# Patient Record
Sex: Female | Born: 1955 | Race: White | Hispanic: No | Marital: Married | Smoking: Current every day smoker
Health system: Southern US, Community
[De-identification: ages and names within clinical notes are randomized; demographics above are authoritative.]

## PROBLEM LIST (undated history)

## (undated) DIAGNOSIS — I498 Other specified cardiac arrhythmias: Secondary | ICD-10-CM

## (undated) DIAGNOSIS — K219 Gastro-esophageal reflux disease without esophagitis: Secondary | ICD-10-CM

## (undated) DIAGNOSIS — M8580 Other specified disorders of bone density and structure, unspecified site: Secondary | ICD-10-CM

## (undated) DIAGNOSIS — E785 Hyperlipidemia, unspecified: Secondary | ICD-10-CM

## (undated) DIAGNOSIS — F32A Depression, unspecified: Secondary | ICD-10-CM

## (undated) DIAGNOSIS — J449 Chronic obstructive pulmonary disease, unspecified: Secondary | ICD-10-CM

## (undated) DIAGNOSIS — M79606 Pain in leg, unspecified: Secondary | ICD-10-CM

## (undated) DIAGNOSIS — F329 Major depressive disorder, single episode, unspecified: Secondary | ICD-10-CM

## (undated) HISTORY — DX: Major depressive disorder, single episode, unspecified: F32.9

## (undated) HISTORY — PX: ABDOMINAL HYSTERECTOMY: SHX81

## (undated) HISTORY — DX: Hyperlipidemia, unspecified: E78.5

## (undated) HISTORY — DX: Gastro-esophageal reflux disease without esophagitis: K21.9

## (undated) HISTORY — DX: Other specified cardiac arrhythmias: I49.8

## (undated) HISTORY — PX: OTHER SURGICAL HISTORY: SHX169

## (undated) HISTORY — DX: Depression, unspecified: F32.A

## (undated) HISTORY — DX: Pain in leg, unspecified: M79.606

## (undated) HISTORY — DX: Other specified disorders of bone density and structure, unspecified site: M85.80

## (undated) HISTORY — DX: Chronic obstructive pulmonary disease, unspecified: J44.9

## (undated) HISTORY — PX: CERVICAL DISC SURGERY: SHX588

---

## 2001-02-02 ENCOUNTER — Encounter: Payer: Self-pay | Admitting: Emergency Medicine

## 2001-02-02 ENCOUNTER — Emergency Department (HOSPITAL_COMMUNITY): Admission: EM | Admit: 2001-02-02 | Discharge: 2001-02-02 | Payer: Self-pay | Admitting: Emergency Medicine

## 2001-02-27 ENCOUNTER — Encounter: Admission: RE | Admit: 2001-02-27 | Discharge: 2001-02-27 | Payer: Self-pay | Admitting: Family Medicine

## 2001-02-27 ENCOUNTER — Encounter: Payer: Self-pay | Admitting: Family Medicine

## 2001-03-18 ENCOUNTER — Encounter: Payer: Self-pay | Admitting: Family Medicine

## 2001-03-18 ENCOUNTER — Ambulatory Visit (HOSPITAL_COMMUNITY): Admission: RE | Admit: 2001-03-18 | Discharge: 2001-03-18 | Payer: Self-pay | Admitting: Family Medicine

## 2001-10-02 ENCOUNTER — Ambulatory Visit (HOSPITAL_COMMUNITY): Admission: RE | Admit: 2001-10-02 | Discharge: 2001-10-02 | Payer: Self-pay | Admitting: Neurosurgery

## 2001-10-27 ENCOUNTER — Encounter: Payer: Self-pay | Admitting: Neurosurgery

## 2001-10-27 ENCOUNTER — Inpatient Hospital Stay (HOSPITAL_COMMUNITY): Admission: RE | Admit: 2001-10-27 | Discharge: 2001-10-29 | Payer: Self-pay | Admitting: Neurosurgery

## 2003-03-02 ENCOUNTER — Ambulatory Visit (HOSPITAL_COMMUNITY): Admission: RE | Admit: 2003-03-02 | Discharge: 2003-03-02 | Payer: Self-pay | Admitting: Family Medicine

## 2003-03-02 ENCOUNTER — Encounter: Payer: Self-pay | Admitting: Family Medicine

## 2003-03-18 ENCOUNTER — Ambulatory Visit (HOSPITAL_COMMUNITY): Admission: RE | Admit: 2003-03-18 | Discharge: 2003-03-18 | Payer: Self-pay | Admitting: Family Medicine

## 2003-03-18 ENCOUNTER — Encounter: Payer: Self-pay | Admitting: Family Medicine

## 2004-08-19 HISTORY — PX: ESOPHAGOGASTRODUODENOSCOPY: SHX1529

## 2004-12-19 ENCOUNTER — Ambulatory Visit (HOSPITAL_COMMUNITY): Admission: RE | Admit: 2004-12-19 | Discharge: 2004-12-19 | Payer: Self-pay | Admitting: Family Medicine

## 2004-12-31 ENCOUNTER — Ambulatory Visit (HOSPITAL_COMMUNITY): Admission: RE | Admit: 2004-12-31 | Discharge: 2004-12-31 | Payer: Self-pay | Admitting: Family Medicine

## 2005-07-22 ENCOUNTER — Ambulatory Visit: Payer: Self-pay | Admitting: Internal Medicine

## 2005-07-24 ENCOUNTER — Ambulatory Visit: Payer: Self-pay | Admitting: Internal Medicine

## 2005-07-24 ENCOUNTER — Ambulatory Visit (HOSPITAL_COMMUNITY): Admission: RE | Admit: 2005-07-24 | Discharge: 2005-07-24 | Payer: Self-pay | Admitting: Internal Medicine

## 2005-08-01 ENCOUNTER — Ambulatory Visit: Payer: Self-pay | Admitting: Internal Medicine

## 2005-09-12 ENCOUNTER — Ambulatory Visit: Payer: Self-pay | Admitting: Internal Medicine

## 2005-10-31 ENCOUNTER — Ambulatory Visit (HOSPITAL_COMMUNITY): Admission: RE | Admit: 2005-10-31 | Discharge: 2005-10-31 | Payer: Self-pay | Admitting: Family Medicine

## 2007-08-28 ENCOUNTER — Ambulatory Visit (HOSPITAL_COMMUNITY): Admission: RE | Admit: 2007-08-28 | Discharge: 2007-08-28 | Payer: Self-pay | Admitting: Family Medicine

## 2008-02-15 ENCOUNTER — Ambulatory Visit (HOSPITAL_COMMUNITY): Admission: RE | Admit: 2008-02-15 | Discharge: 2008-02-15 | Payer: Self-pay | Admitting: Internal Medicine

## 2010-06-19 ENCOUNTER — Ambulatory Visit (HOSPITAL_COMMUNITY): Admission: RE | Admit: 2010-06-19 | Discharge: 2010-06-19 | Payer: Self-pay | Admitting: Internal Medicine

## 2011-01-04 NOTE — Op Note (Signed)
NAME:  Rebecca Cohen, Rebecca Cohen             ACCOUNT NO.:  [id number]   MEDICAL RECORD NO.:  [id number]          PATIENT TYPE:  AMB   LOCATION:  DAY                           FACILITY:  [facility]   PHYSICIAN:  R. Roetta Sessions, M.D. DATE OF BIRTH:  08-Apr-1956   DATE OF PROCEDURE:  07/24/2005  DATE OF DISCHARGE:                                 OPERATIVE REPORT   PROCEDURE:  Esophagogastroduodenoscopy with Elease Hashimoto dilation.   INDICATION FOR PROCEDURE:  A 55 year old lady with longstanding  gastroesophageal reflux disease symptoms, intermittent esophageal dysphagia,  undergoing EGD now being done.  The procedure has been discussed with the  patient at length.  The potential risks, benefits, and alternatives have  been reviewed and questions answered and she is agreeable.  Please see  documentation in the medical record.   PROCEDURE NOTE:  O2 saturation, blood pressure, and pulse oximetry monitored  throughout the entire procedure.  Conscious sedation with Versed 6 mg IV,  Demerol 125 mg IV in divided doses.   INSTRUMENT:  Olympus video chip system.   ANESTHESIA:  Cetacaine spray for topical pharyngeal anesthesia.   FINDINGS:  EGD examination of the tubular esophagus revealed what appeared  to be a short cervical esophageal web, otherwise esophageal mucosa appeared  normal.  EG junction was easily traversed.   Stomach:  Gastric cavity was emptied and insufflated well with air.  Thorough examination of the gastric mucosa included retroflexion of the  proximal stomach.  Esophagogastric junction demonstrated no abnormalities.  Pylorus was patent and easily traversed.  Examination of the bulb and second  portion revealed no abnormalities.   Therapeutic/diagnostic maneuvers were performed.  A 54-French Maloney  dilator was passed through the colonic structures with good patient  tolerance.  A look back revealed the web had been ruptured with a  superficial rent of the esophageal mucosa to the UES.   The patient tolerated  the procedure well and was reactive after endoscopy.   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  Otherwise normal esophagus, normal stomach D1 and D2.   RECOMMENDATIONS:  1.  Clear liquids for the remainder of the day.  2.  Advance diet as tolerated tomorrow.  3.  Stop Prilosec.  Begin Zegerid 40 mg orally once daily each morning.  4.  Plan to see this nice lady back in the office in 6-8 weeks.      Rebecca Cohen, M.D.  Electronically Signed     RMR/MEDQ  D:  07/24/2005  T:  07/24/2005  Job:  [id number]   cc:   Mila Homer. Sudie Bailey, M.D.  Fax: [phone]

## 2011-01-04 NOTE — H&P (Signed)
[place]. [facility]  Patient:    Rebecca Cohen, Rebecca Cohen Visit Number: [id number] MRN: [id number]          Service Type: SUR Location: 3000 3021 [id number] Attending Physician:  Emeterio Reeve Dictated by:   Payton Doughty, M.D. Admit Date:  10/27/2001                           History and Physical  ADMISSION DIAGNOSIS:  Spondylosis, C5-C6 and C6-C7.  HISTORY OF PRESENT ILLNESS:  A 55 year old right-handed white girl whos history and physical was initially done by Dr. Newell Coral when she visited in his office in September. She had had pain around her scapular region. Films showed disc herniation at C5-C6 and C6-C7. She did not want to have an operation. She tried NSAIDs, which did not work for her and then she asked me to see her. She has a disc at C5-C6 and C6-C7. She is now admitted for anterior cervicectomy and fusion.  PAST MEDICAL HISTORY:  Remarkable for uterine fibroids.  ALLERGIES:  SULFA.  CURRENT MEDICATIONS: 1. Fosamax 70 mg a week. 2. Hydrocodone. 3. Ativan for panic attacks.  PAST SURGICAL HISTORY:  A vaginal hysterectomy in the past.  FAMILY HISTORY:  Her mama is 58, her daddy is 97. Daddy has diabetes.  SOCIAL HISTORY:  She is not working because of foot pain. She smokes 1/2 pack of cigarettes a day and does not drink alcohol.  REVIEW OF SYSTEMS:  Remarkable for back pain.  PHYSICAL EXAMINATION:  HEENT:  Within normal limits. She has good range of motion.  NECK/CHEST:  Clear.  CARDIAC:  Regular rate and rhythm.  ABDOMEN:  Nontender. No hepatosplenomegaly.  EXTREMITIES:  Without clubbing, cyanosis.  GU:  Deferred.  EXTREMITIES:  Peripheral pulses were good.  NEUROLOGICALLY:  She is awake, alert and oriented. Cranial nerves are intact. Motor exam shows 5/5 strength throughout the upper extremities, except for the right biceps and triceps 5-/5. Range of motion reproduces her neck, shoulder, and arm pain.  An MRI shows significant  spondylotic disease and canal compromise, worse at C5-C6 and C6-C7. Bifemoral compromise at C6-C7. Hoffmans is negative and there is no evidence of myelopathy.  CLINICAL IMPRESSION:  Cervical spondylosis with right C6-C7 radiculopathies.  PLAN:  Anterior cervicectomy and fusion of C5-C6 and C6-C7. The risks and benefits have been discussed with her and she wished to proceed. Dictated by:   Payton Doughty, M.D. Attending Physician:  Emeterio Reeve DD:  10/27/01 TD:  10/28/01 Job: [id number]

## 2011-01-04 NOTE — Op Note (Signed)
[place]. [facility]  Patient:    Rebecca Cohen, Rebecca Cohen Visit Number: [id number] MRN: [id number]          Service Type: SUR Location: 3000 3021 01 Attending Physician:  Emeterio Reeve Dictated by:   Payton Doughty, M.D. Proc. Date: 10/27/01 Admit Date:  10/27/2001                             Operative Report  PREOPERATIVE DIAGNOSIS:  Herniated disk C5-6, spondylosis at C6-7.  POSTOPERATIVE DIAGNOSIS:  Herniated disk C5-6, spondylosis at C6-7.  PROCEDURE:  C5-6, C6-7 anterior cervical diskectomy and fusion with Tether plate.  SURGEON:  Payton Doughty, M.D.  NURSE ASSISTANT:  [facility].  DOCTOR ASSISTANT:  Tanya Nones. Jeral Fruit, M.D.  ANESTHESIA:  General endotracheal.  PREPARATION:  Sterile Betadine prep and scrub with alcohol wipe.  COMPLICATIONS:  None.  DESCRIPTION OF PROCEDURE:  This is a 55 year old right-handed white lady with several spondylosis and disk.  She was taken to the operating room and smoothly anesthetized and intubated, placed supine on the operating table in the Holter head traction.  Following shave, prep, and drape in the usual sterile fashion, the skin was incised in the midline on the medial border of the sternocleidomastoid muscle on the left side one fingerbreadth below the level of the carotid tubercle.  The platysma was identified, elevated, divided, and undermined.  The sternocleidomastoid was identified.  Medial dissection revealed the carotid artery retracted laterally to the left, trachea and esophagus retracted laterally to the right, exposing the bones of the anterior cervical spine.  A marker was placed, intraoperative x-ray obtained to confirm correctness of the level.  Having confirmed correctness of the level, the longus colli was taken down and the Shadow Line retractor placed.  Diskectomy was carried out at C5-6 and C6-7 under gross observation. The operating microscope was then brought in and microsurgical  technique used to dissect the anterior epidural space and remove the remaining disk at both 5-6 and 6-7.  At 5-6 there was a fairly large herniated disk with posterior cord compression that extended toward the right neural foramen.  At 6-7 there was mostly osteophytic disease with prominent compression of the right C7 nerve root.  This was decompressed readily.  The left side was not too much affected.  The wound was irrigated and hemostasis assured.  Seven millimeter bone grafts were fashioned from patellar allograft and tapped into place.  A three-hole Tether plate was then placed with 12 mm screws, two in C5, one in C6, and two in C7.  Intraoperative x-ray showed good placement of bone graft and plate and screws.  The platysma was reapproximated with 3-0 Vicryl in interrupted fashion, the subcutaneous tissue was reapproximated with 3-0 Vicryl in interrupted fashion, and the skin was closed with 4-0 Vicryl in a running subcuticular fashion.  Benzoin and Steri-Strips were placed, made occlusive with Telfa and OpSite.  The patient was then placed in an Aspen collar and returned to the recovery room in good condition. Dictated by:   Payton Doughty, M.D. Attending Physician:  Emeterio Reeve DD:  10/27/01 TD:  10/28/01 Job: [id number]

## 2011-01-04 NOTE — H&P (Signed)
NAME:  Rebecca Cohen, Rebecca Cohen             ACCOUNT NO.:  [id number]   MEDICAL RECORD NO.:  [id number]          PATIENT TYPE:  AMB   LOCATION:                                FACILITY:  [facility]   PHYSICIAN:  R. Roetta Sessions, M.D. DATE OF BIRTH:  [birth year]   DATE OF ADMISSION:  07/22/2005  DATE OF DISCHARGE:  LH                                HISTORY & PHYSICAL   REASON FOR CONSULTATION:  GERD symptoms.   HISTORY OF PRESENT ILLNESS:  Mrs. Rebecca Cohen is a pleasant 55 year old  Caucasian female smoker sent over courtesy of Dr. Gareth Morgan to further  evaluate a several year history of reflux symptoms which he describes as  heartburn on and off.  For years he has been on Nexium and more recently  Prilosec.  Nexium caused abdominal pain but did help her reflux symptoms.  Prilosec is felt to cause constipation, although, it makes some positive  impact on management of her reflux symptoms.  She still has symptoms 3-4  times weekly.  She previously had a habit of consuming quite a bit of  caffeinated beverages.  She drank regular sundrop all the time like water.  At Dr. Michelle Nasuti direction, she has curtailed her caffeinated/carbonated  beverage intake significantly with some improvement of her symptoms.  She  had been on Fosamax for osteopenia which was also stopped recently.  She  does not consume any alcohol or nonsteroidals.  She has occasional  esophageal dysphagia to solid foods.  She has not lost any weight.  She has  intermittent left sided abdominal pain associated with some constipation  which had been a chronic problem.  Abdominal pain settles down after she has  a bowel movement.  She has never had any melena or rectal bleeding.  There  is no family history of GI neoplasia.   PAST MEDICAL HISTORY:  1.  Depression.  2.  Reflux.  3.  Osteopenia.   PAST SURGICAL HISTORY:  1.  C4-5 surgery.  2.  Hysterectomy.   CURRENT MEDICATIONS:  1.  Lorazepam 2 mg t.i.d.  2.  __________  150 mg b.i.d.  3.  Lorcet 7.5/650 one t.i.d.  4.  Calcium and vitamin D b.i.d.  5.  Multivitamin daily.  6.  Bentyl 10 mg daily.  7.  Benadryl 25 mg daily.  8.  Prilosec 20 mg daily.   ALLERGIES:  SULFA.   FAMILY HISTORY:  Mother and father are alive.  Mother is 51.  Father is age  53.  He is diabetic and has heart problems.  Otherwise, no history of  chronic GI or liver illness.   SOCIAL HISTORY:  The patient is married.  She is employed with [employer].  She smokes one half pack of cigarettes per day.  No alcohol  consumption or illicit drugs.   REVIEW OF SYSTEMS:  No early satiety.  No nausea, vomiting, change in  weight.  No fever, chills, chest pain, dyspnea.   PHYSICAL EXAMINATION:  GENERAL APPEARANCE:  Pleasant 55 year old lady,  resting comfortably.  VITAL SIGNS:  Weight 136.5, height 5 feet 6  inches, temperature 98.4, blood  pressure 100/60, pulse 64.  SKIN:  Warm and dry.  No jaundice.  No continuous __________ or chronic  liver disease.  HEENT:  No scleral icterus.  Conjunctivae are pink.  CHEST:  Lungs are clear to auscultation.  CARDIAC:  Regular rate and rhythm without murmurs, rubs or gallops.  BREAST:  Deferred.  ABDOMEN:  Nondistended, positive bowel sounds, soft, nontender without  appreciable mass or organomegaly.  EXTREMITIES:  No edema.   IMPRESSION:  Mrs. Rebecca Cohen is a pleasant 55 year old lady with a long  history of gastroesophageal reflux disease symptoms.  She has had  improvement in her symptoms with omeprazole or esomeprazole, but really has  not had a great response with either agents by her description.  She also  has intermittent esophageal dysphagia and I am glad to see the Fosamax has  been stopped.  She has employed significant dietary modification at the  direction of Dr. Sudie Bailey, with which I certainly concur.   She has left sided abdominal pain intermittently and typically related to  constipation.    RECOMMENDATIONS:  Will proceed with an EEG to evaluate her long-standing  reflux symptoms and her esophageal dysphagia symptoms in the very near  future.  Potential risks, benefits and alternatives have been reviewed.  We  will go ahead and start her on some MiraLax 17 g orally at bedtime p.r.n.  constipation, and we will send her home with three hemoccults in the  interim.  I see no reason to evaluate her bowel symptoms further unless she  were to be hemoccult positive.  I pointed out to her that she will be due a  screening colonoscopy at age 55.   Further recommendations to follow.   I would like to thank Dr. Gareth Morgan for allowing [place] to see this very  nice lady today.      Jonathon Bellows, M.D.  Electronically Signed     RMR/MEDQ  D:  07/22/2005  T:  07/22/2005  Job:  [id number]   cc:   Mila Homer. Sudie Bailey, M.D.  Fax: [phone]

## 2011-01-17 ENCOUNTER — Other Ambulatory Visit (HOSPITAL_COMMUNITY): Payer: Self-pay | Admitting: Internal Medicine

## 2011-01-17 DIAGNOSIS — Z139 Encounter for screening, unspecified: Secondary | ICD-10-CM

## 2011-01-21 ENCOUNTER — Ambulatory Visit (HOSPITAL_COMMUNITY): Admission: RE | Admit: 2011-01-21 | Payer: BC Managed Care – PPO | Source: Ambulatory Visit

## 2011-01-22 ENCOUNTER — Ambulatory Visit (HOSPITAL_COMMUNITY): Payer: Self-pay

## 2011-02-21 ENCOUNTER — Telehealth: Payer: Self-pay

## 2011-02-21 NOTE — Telephone Encounter (Signed)
Pt scheduled for OV on 02/25/2011 @ 8:00 Am with LL.

## 2011-02-25 ENCOUNTER — Telehealth: Payer: Self-pay | Admitting: Gastroenterology

## 2011-02-25 ENCOUNTER — Ambulatory Visit: Payer: BC Managed Care – PPO | Admitting: Gastroenterology

## 2011-02-25 NOTE — Telephone Encounter (Signed)
Please ask patient to reschedule OV.

## 2011-02-25 NOTE — Telephone Encounter (Signed)
R/S for 02/27/11

## 2011-02-27 ENCOUNTER — Ambulatory Visit (INDEPENDENT_AMBULATORY_CARE_PROVIDER_SITE_OTHER): Payer: BC Managed Care – PPO | Admitting: Gastroenterology

## 2011-02-27 ENCOUNTER — Encounter: Payer: Self-pay | Admitting: Gastroenterology

## 2011-02-27 VITALS — BP 110/70 | HR 68 | Temp 96.6°F | Ht 67.0 in | Wt 129.8 lb

## 2011-02-27 DIAGNOSIS — Z800 Family history of malignant neoplasm of digestive organs: Secondary | ICD-10-CM

## 2011-02-27 DIAGNOSIS — K5909 Other constipation: Secondary | ICD-10-CM

## 2011-02-27 NOTE — Progress Notes (Signed)
Cc to PCP 

## 2011-02-27 NOTE — Assessment & Plan Note (Signed)
Recommend daily fiber supplement.

## 2011-02-27 NOTE — Assessment & Plan Note (Signed)
Family history of colon cancer, brother at age 53. Recommend high-risk screening colonoscopy. Procedure to be done with deep sedation with Propofol due to polypharmacy. In 2006 at time of EGD, she received Versed 6 mg, Demerol 125 mg. Patient states that she was not adequately sedated.  I have discussed the risks, alternatives, benefits with regards to but not limited to the risk of reaction to medication, bleeding, infection, perforation and the patient is agreeable to proceed. Written consent to be obtained.

## 2011-02-27 NOTE — Progress Notes (Signed)
Primary Care Physician:  Cassell Smiles., MD  Primary Gastroenterologist:  Roetta Sessions, MD  Chief Complaint  Patient presents with  . FH Colon cancer/ pt never had colonoscopy    Brother deceased 2011-12-21with colon cancer    HPI:  Rebecca Cohen is a 55 y.o. female here to schedule colonoscopy. Her brother, [name], was diagnosed with colon cancer at age 76 and subsequently died of complications of the surgery.  No other family history of colon cancer. No melena, brbpr, diarrhea, abdominal pain. Occasional constipation, Metamucil prn. Some heartburn, takes Prilosec OTC. No dysphagia. No n/v. No weight loss. No prior colonoscopy.  Current Outpatient Prescriptions  Medication Sig Dispense Refill  . acyclovir (ZOVIRAX) 400 MG tablet Take 400 mg by mouth every 4 (four) hours while awake. One tablet five times daily       . albuterol (PROVENTIL) 2 MG tablet Take 2 mg by mouth 3 (three) times daily. One - two tablets daily       . amoxicillin-clavulanate (AUGMENTIN) 875-125 MG per tablet Take 1 tablet by mouth 2 (two) times daily.        . citalopram (CELEXA) 20 MG tablet Take 20 mg by mouth daily.        . Cyanocobalamin (B-12) 100 MCG TABS Take by mouth 1 day or 1 dose.        Marland Kitchen HYDROcodone-acetaminophen (LORTAB) 7.5-500 MG per tablet Take 1 tablet by mouth every 6 (six) hours as needed.        Marland Kitchen LORazepam (ATIVAN) 2 MG tablet Take 2 mg by mouth every 6 (six) hours as needed. One tablet 3-4 times daily as needed       . simvastatin (ZOCOR) 10 MG tablet Take 10 mg by mouth at bedtime.          Allergies as of 02/27/2011 - Review Complete 02/27/2011  Allergen Reaction Noted  . Bextra (valdecoxib) Anaphylaxis 02/27/2011  . Sulfa drugs cross reactors Anaphylaxis and Rash 02/27/2011    Past Medical History  Diagnosis Date  . Depression   . GERD (gastroesophageal reflux disease)   . Osteopenia     previously on Fosamax  . COPD (chronic obstructive pulmonary disease)   .  Hyperlipidemia     Past Surgical History  Procedure Date  . S/p hysterectomy   . Cervical disc surgery   . 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    Family History  Problem Relation Age of Onset  . Colon cancer Brother 50    deceased    History   Social History  . Marital Status: Married    Spouse Name: N/A    Number of Children: N/A  . Years of Education: N/A   Occupational History  . works in Programmer, systems    Social History Main Topics  . Smoking status: Current Everyday Smoker    Types: Cigarettes  . Smokeless tobacco: Not on file   Comment: 1/2 pack a day  . Alcohol Use: No  . Drug Use: Not on file  . Sexually Active: Not on file   Other Topics Concern  . Not on file   Social History Narrative  . No narrative on file      ROS:  General: Negative for anorexia, weight loss, fever, chills, fatigue, weakness. Eyes: Negative for vision changes.  ENT: Negative for hoarseness, difficulty swallowing , nasal congestion. CV: Negative for chest pain, angina, palpitations, dyspnea on exertion, peripheral edema.  Respiratory: Negative for dyspnea at rest, dyspnea on exertion, cough, sputum, wheezing.  GI: See history of present illness. GU:  Negative for dysuria, hematuria, urinary incontinence, urinary frequency, nocturnal urination.  MS: Negative for joint pain, low back pain.  Derm: Negative for rash or itching.  Neuro: Negative for weakness, abnormal sensation, seizure, frequent headaches, memory loss, confusion.  Psych: Negative for anxiety, depression, suicidal ideation, hallucinations.  Endo: Negative for unusual weight change.  Heme: Negative for bruising or bleeding. Allergy: Negative for rash or hives.    Physical Examination:  BP 110/70  Pulse 68  Temp 96.6 F (35.9 C)  Ht 5\' 7"  (1.702 m)  Wt 129 lb 12.8 oz (58.877 kg)  BMI 20.33 kg/m2   General: Well-nourished, well-developed in no  acute distress.  Head: Normocephalic, atraumatic.   Eyes: Conjunctiva pink, no icterus. Mouth: Oropharyngeal mucosa moist and pink , no lesions erythema or exudate. Neck: Supple without thyromegaly, masses, or lymphadenopathy.  Lungs: Clear to auscultation bilaterally.  Heart: Regular rate and rhythm, no murmurs rubs or gallops.  Abdomen: Bowel sounds are normal, nontender, nondistended, no hepatosplenomegaly or masses, no abdominal bruits or    hernia , no rebound or guarding.   Extremities: No lower extremity edema.  Neuro: Alert and oriented x 4 , grossly normal neurologically.  Skin: Warm and dry, no rash or jaundice.   Psych: Alert and cooperative, normal mood and affect.

## 2011-03-07 ENCOUNTER — Inpatient Hospital Stay (HOSPITAL_COMMUNITY): Admission: RE | Admit: 2011-03-07 | Payer: BC Managed Care – PPO | Source: Ambulatory Visit

## 2011-03-12 ENCOUNTER — Other Ambulatory Visit: Payer: Self-pay

## 2011-03-12 ENCOUNTER — Encounter (HOSPITAL_COMMUNITY): Payer: Self-pay

## 2011-03-12 ENCOUNTER — Encounter (HOSPITAL_COMMUNITY)
Admission: RE | Admit: 2011-03-12 | Discharge: 2011-03-12 | Disposition: A | Discharge status: Home / Self Care | Payer: BC Managed Care – PPO | Source: Ambulatory Visit | Attending: Internal Medicine | Admitting: Internal Medicine

## 2011-03-12 LAB — DIFFERENTIAL
Basophils Absolute: 0 10*3/uL (ref 0.0–0.1)
Eosinophils Relative: 3 % (ref 0–5)
Lymphocytes Relative: 39 % (ref 12–46)
Lymphs Abs: 3.2 10*3/uL (ref 0.7–4.0)
Neutrophils Relative %: 49 % (ref 43–77)

## 2011-03-12 LAB — CBC
MCV: 95.7 fL (ref 78.0–100.0)
Platelets: 163 10*3/uL (ref 150–400)
RBC: 3.91 MIL/uL (ref 3.87–5.11)
RDW: 12.5 % (ref 11.5–15.5)
WBC: 8.2 10*3/uL (ref 4.0–10.5)

## 2011-03-12 LAB — BASIC METABOLIC PANEL
CO2: 25 mEq/L (ref 19–32)
Calcium: 9.5 mg/dL (ref 8.4–10.5)
GFR calc non Af Amer: >60 mL/min (ref >60)
Glucose, Bld: 95 mg/dL (ref 70–99)
Potassium: 3.8 mEq/L (ref 3.5–5.1)
Sodium: 140 mEq/L (ref 135–145)

## 2011-03-12 NOTE — Patient Instructions (Addendum)
22 Rebecca Cohen  03/12/2011   Your procedure is scheduled on:  03/14/2011  Report to [facility] at  930 AM.  Call this number if you have problems the morning of surgery: [phone]   Remember:   Do not eat food:After Midnight.  Do not drink clear liquids: After Midnight.  Take these medicines the morning of surgery with A SIP OF WATER: Celexa,Ativan.Take albuterol before you come.   Do not wear jewelry, make-up or nail polish.  Do not bring valuables to the hospital.  Contacts, dentures or bridgework may not be worn into surgery.  Leave suitcase in the car. After surgery it may be brought to your room.  For patients admitted to the hospital, checkout time is 11:00 AM the day of discharge.   Patients discharged the day of surgery will not be allowed to drive home.  Name and phone number of your driver:husband  Special Instructions: N/A   Please read over the following fact sheets that you were given: Pain Booklet, Surgical Site Infection Prevention and Anesthesia Post-op Instructions PATIENT INSTRUCTIONS POST-ANESTHESIA  IMMEDIATELY FOLLOWING SURGERY:  Do not drive or operate machinery for the first twenty four hours after surgery.  Do not make any important decisions for twenty four hours after surgery or while taking narcotic pain medications or sedatives.  If you develop intractable nausea and vomiting or a severe headache please notify your doctor immediately.  FOLLOW-UP:  Please make an appointment with your surgeon as instructed. You do not need to follow up with anesthesia unless specifically instructed to do so.  WOUND CARE INSTRUCTIONS (if applicable):  Keep a dry clean dressing on the anesthesia/puncture wound site if there is drainage.  Once the wound has quit draining you may leave it open to air.  Generally you should leave the bandage intact for twenty four hours unless there is drainage.  If the epidural site drains for more than 36-48 hours please call the anesthesia  department.  QUESTIONS?:  Please feel free to call your physician or the hospital operator if you have any questions, and they will be happy to assist you.     [facility] Anesthesia Department [address] [place] [phone]

## 2011-03-14 ENCOUNTER — Encounter (HOSPITAL_COMMUNITY): Payer: Self-pay | Admitting: Anesthesiology

## 2011-03-14 ENCOUNTER — Encounter: Payer: BC Managed Care – PPO | Admitting: Internal Medicine

## 2011-03-14 ENCOUNTER — Encounter (HOSPITAL_COMMUNITY)
Admission: RE | Disposition: A | Discharge status: Home / Self Care | Payer: Self-pay | Source: Ambulatory Visit | Attending: Internal Medicine

## 2011-03-14 ENCOUNTER — Ambulatory Visit (HOSPITAL_COMMUNITY)
Admission: RE | Admit: 2011-03-14 | Discharge: 2011-03-14 | Disposition: A | Discharge status: Home / Self Care | Payer: BC Managed Care – PPO | Source: Ambulatory Visit | Attending: Internal Medicine | Admitting: Internal Medicine

## 2011-03-14 ENCOUNTER — Ambulatory Visit (HOSPITAL_COMMUNITY): Payer: BC Managed Care – PPO | Admitting: Anesthesiology

## 2011-03-14 DIAGNOSIS — E785 Hyperlipidemia, unspecified: Secondary | ICD-10-CM | POA: Insufficient documentation

## 2011-03-14 DIAGNOSIS — Z01810 Encounter for preprocedural cardiovascular examination: Secondary | ICD-10-CM | POA: Insufficient documentation

## 2011-03-14 DIAGNOSIS — J4489 Other specified chronic obstructive pulmonary disease: Secondary | ICD-10-CM | POA: Insufficient documentation

## 2011-03-14 DIAGNOSIS — Z01812 Encounter for preprocedural laboratory examination: Secondary | ICD-10-CM | POA: Insufficient documentation

## 2011-03-14 DIAGNOSIS — J449 Chronic obstructive pulmonary disease, unspecified: Secondary | ICD-10-CM | POA: Insufficient documentation

## 2011-03-14 DIAGNOSIS — Z800 Family history of malignant neoplasm of digestive organs: Secondary | ICD-10-CM

## 2011-03-14 DIAGNOSIS — Z1211 Encounter for screening for malignant neoplasm of colon: Secondary | ICD-10-CM

## 2011-03-14 HISTORY — PX: COLONOSCOPY: SHX5424

## 2011-03-14 SURGERY — COLONOSCOPY
Anesthesia: Monitor Anesthesia Care

## 2011-03-14 MED ORDER — PROPOFOL 10 MG/ML IV EMUL
INTRAVENOUS | Status: AC
  Filled 2011-03-14: qty 20

## 2011-03-14 MED ORDER — PROPOFOL 10 MG/ML IV EMUL
INTRAVENOUS | Status: DC | PRN
  Administered 2011-03-14: 100 ug/kg/min via INTRAVENOUS

## 2011-03-14 MED ORDER — ARTIFICIAL TEARS OP OINT
TOPICAL_OINTMENT | OPHTHALMIC | Status: AC
  Filled 2011-03-14: qty 3.5

## 2011-03-14 MED ORDER — GLYCOPYRROLATE 0.2 MG/ML IJ SOLN
0.2000 mg | Freq: Once | INTRAMUSCULAR | Status: AC
  Administered 2011-03-14: 0.2 mg via INTRAVENOUS

## 2011-03-14 MED ORDER — FENTANYL CITRATE 0.05 MG/ML IJ SOLN: 25.0000 ug | INTRAMUSCULAR | Status: DC | PRN

## 2011-03-14 MED ORDER — GLYCOPYRROLATE 0.2 MG/ML IJ SOLN
INTRAMUSCULAR | Status: AC
  Administered 2011-03-14: 0.2 mg via INTRAVENOUS
  Filled 2011-03-14: qty 1

## 2011-03-14 MED ORDER — FENTANYL CITRATE 0.05 MG/ML IJ SOLN
INTRAMUSCULAR | Status: DC | PRN
  Administered 2011-03-14: 50 ug via INTRAVENOUS

## 2011-03-14 MED ORDER — LACTATED RINGERS IV SOLN
INTRAVENOUS | Status: DC
  Administered 2011-03-14: 12:00:00 via INTRAVENOUS
  Administered 2011-03-14: 500 mL via INTRAVENOUS

## 2011-03-14 MED ORDER — LACTATED RINGERS IV SOLN
INTRAVENOUS | Status: DC | PRN
  Administered 2011-03-14: 12:00:00 via INTRAVENOUS

## 2011-03-14 MED ORDER — ONDANSETRON HCL 4 MG/2ML IJ SOLN
4.0000 mg | Freq: Once | INTRAMUSCULAR | Status: AC
  Administered 2011-03-14: 4 mg via INTRAVENOUS

## 2011-03-14 MED ORDER — MIDAZOLAM HCL 2 MG/2ML IJ SOLN
1.0000 mg | INTRAMUSCULAR | Status: DC | PRN
  Administered 2011-03-14: 2 mg via INTRAVENOUS

## 2011-03-14 MED ORDER — STERILE WATER FOR IRRIGATION IR SOLN
Status: DC | PRN
  Administered 2011-03-14: 13:00:00

## 2011-03-14 MED ORDER — MIDAZOLAM HCL 2 MG/2ML IJ SOLN
INTRAMUSCULAR | Status: AC
  Administered 2011-03-14: 2 mg via INTRAVENOUS
  Filled 2011-03-14: qty 2

## 2011-03-14 MED ORDER — ONDANSETRON HCL 4 MG/2ML IJ SOLN
INTRAMUSCULAR | Status: AC
  Administered 2011-03-14: 4 mg via INTRAVENOUS
  Filled 2011-03-14: qty 2

## 2011-03-14 MED ORDER — ONDANSETRON HCL 4 MG/2ML IJ SOLN: 4.0000 mg | Freq: Once | INTRAMUSCULAR | Status: DC | PRN

## 2011-03-14 MED ORDER — FENTANYL CITRATE 0.05 MG/ML IJ SOLN
INTRAMUSCULAR | Status: AC
  Filled 2011-03-14: qty 2

## 2011-03-14 SURGICAL SUPPLY — 22 items
ELECT REM PT RETURN 9FT ADLT (ELECTROSURGICAL)
ELECTRODE REM PT RTRN 9FT ADLT (ELECTROSURGICAL) IMPLANT
FCP BXJMBJMB 240X2.8X (CUTTING FORCEPS)
FLOOR PAD 36X40 (MISCELLANEOUS) ×2
FORCEPS BIOP RAD 4 LRG CAP 4 (CUTTING FORCEPS) IMPLANT
FORCEPS BIOP RJ4 240 W/NDL (CUTTING FORCEPS)
FORCEPS BXJMBJMB 240X2.8X (CUTTING FORCEPS) IMPLANT
INJECTOR/SNARE I SNARE (MISCELLANEOUS) IMPLANT
LUBRICANT JELLY 4.5OZ STERILE (MISCELLANEOUS) ×2 IMPLANT
MANIFOLD NEPTUNE II (INSTRUMENTS) ×2 IMPLANT
NEEDLE SCLEROTHERAPY 25GX240 (NEEDLE) IMPLANT
PAD FLOOR 36X40 (MISCELLANEOUS) ×1 IMPLANT
PROBE APC STR FIRE (PROBE) IMPLANT
PROBE INJECTION GOLD (MISCELLANEOUS)
PROBE INJECTION GOLD 7FR (MISCELLANEOUS) IMPLANT
SNARE ROTATE MED OVAL 20MM (MISCELLANEOUS) IMPLANT
SYR 50ML LL SCALE MARK (SYRINGE) ×2 IMPLANT
TRAP SPECIMEN MUCOUS 40CC (MISCELLANEOUS) IMPLANT
TUBING ENDO SMARTCAP (MISCELLANEOUS) ×2 IMPLANT
TUBING ENDO SMARTCAP PENTAX (MISCELLANEOUS) IMPLANT
TUBING IRRIGATION ENDOGATOR (MISCELLANEOUS) ×2 IMPLANT
WATER STERILE IRR 1000ML POUR (IV SOLUTION) ×2 IMPLANT

## 2011-03-14 NOTE — Progress Notes (Signed)
   Colonoscopy Discharge Instructions  Read the instructions outlined below and refer to this sheet in the next few weeks. These discharge instructions provide you with general information on caring for yourself after you leave the hospital. Your doctor may also give you specific instructions. While your treatment has been planned according to the most current medical practices available, unavoidable complications occasionally occur. If you have any problems or questions after discharge, call Dr. Jena Gauss at [phone]. ACTIVITY  You may resume your regular activity, but move at a slower pace for the next 24 hours.   Take frequent rest periods for the next 24 hours.   Walking will help get rid of the air and reduce the bloated feeling in your belly (abdomen).   No driving for 24 hours (because of the medicine (anesthesia) used during the test).    Do not sign any important legal documents or operate any machinery for 24 hours (because of the anesthesia used during the test).  NUTRITION  Drink plenty of fluids.   You may resume your normal diet as instructed by your doctor.   Begin with a light meal and progress to your normal diet. Heavy or fried foods are harder to digest and may make you feel sick to your stomach (nauseated).   Avoid alcoholic beverages for 24 hours or as instructed.  MEDICATIONS  You may resume your normal medications unless your doctor tells you otherwise.  WHAT YOU CAN EXPECT TODAY  Some feelings of bloating in the abdomen.   Passage of more gas than usual.   Spotting of blood in your stool or on the toilet paper.  IF YOU HAD POLYPS REMOVED DURING THE COLONOSCOPY:  No aspirin products for 7 days or as instructed.   No alcohol for 7 days or as instructed.   Eat a soft diet for the next 24 hours.  FINDING OUT THE RESULTS OF YOUR TEST Not all test results are available during your visit. If your test results are not back during the visit, make an appointment  with your caregiver to find out the results. Do not assume everything is normal if you have not heard from your caregiver or the medical facility. It is important for you to follow up on all of your test results.  SEEK IMMEDIATE MEDICAL ATTENTION IF:  You have more than a spotting of blood in your stool.   Your belly is swollen (abdominal distention).   You are nauseated or vomiting.   You have a temperature over 101.   You have abdominal pain or discomfort that is severe or gets worse throughout the day.        Constipation literature provided to the patient.  MiraLax 17 g orally at bedtime daily as needed if no good bowel movement on any given day.  You should take a daily fiber supplement.  Repeat high risk screening colonoscopy in 5 years.

## 2011-03-14 NOTE — H&P (Signed)
Rebecca Coast, PA  02/27/2011  3:09 PM  Signed Primary Care Physician:  Rebecca Cohen., MD   Primary Gastroenterologist:  Rebecca Sessions, MD    Chief Complaint   Patient presents with   .  FH Colon cancer/ pt never had colonoscopy       Brother deceased January 03, 2012with colon cancer      HPI:  Rebecca Cohen is a 55 y.o. female here to schedule colonoscopy. Her brother, Rebecca Cohen, was diagnosed with colon cancer at age 41 and subsequently died of complications of the surgery.  No other family history of colon cancer. No melena, brbpr, diarrhea, abdominal pain. Occasional constipation, Metamucil prn. Some heartburn, takes Prilosec OTC. No dysphagia. No n/v. No weight loss. No prior colonoscopy.    Current Outpatient Prescriptions   Medication  Sig  Dispense  Refill   .  acyclovir (ZOVIRAX) 400 MG tablet  Take 400 mg by mouth every 4 (four) hours while awake. One tablet five times daily          .  albuterol (PROVENTIL) 2 MG tablet  Take 2 mg by mouth 3 (three) times daily. One - two tablets daily          .  amoxicillin-clavulanate (AUGMENTIN) 875-125 MG per tablet  Take 1 tablet by mouth 2 (two) times daily.           .  citalopram (CELEXA) 20 MG tablet  Take 20 mg by mouth daily.           .  Cyanocobalamin (B-12) 100 MCG TABS  Take by mouth 1 day or 1 dose.           Marland Kitchen  HYDROcodone-acetaminophen (LORTAB) 7.5-500 MG per tablet  Take 1 tablet by mouth every 6 (six) hours as needed.           Marland Kitchen  LORazepam (ATIVAN) 2 MG tablet  Take 2 mg by mouth every 6 (six) hours as needed. One tablet 3-4 times daily as needed          .  simvastatin (ZOCOR) 10 MG tablet  Take 10 mg by mouth at bedtime.               Allergies as of 02/27/2011 - Review Complete 02/27/2011   Allergen  Reaction  Noted   .  Bextra (valdecoxib)  Anaphylaxis  02/27/2011   .  Sulfa drugs cross reactors  Anaphylaxis and Rash  02/27/2011       Past Medical History   Diagnosis  Date   .  Depression     .  GERD  (gastroesophageal reflux disease)     .  Osteopenia         previously on Fosamax   .  COPD (chronic obstructive pulmonary disease)     .  Hyperlipidemia         Past Surgical History   Procedure  Date   .  S/p hysterectomy     .  Cervical disc surgery     .  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       Family History   Problem  Relation  Age of Onset   .  Colon cancer  Brother  50       deceased       History       Social History   .  Marital Status:  Married  Spouse Name:  N/A       Number of Children:  N/A   .  Years of Education:  N/A       Occupational History   .  works in Programmer, systems         Social History Main Topics   .  Smoking status:  Current Everyday Smoker       Types:  Cigarettes   .  Smokeless tobacco:  Not on file     Comment: 1/2 pack a day   .  Alcohol Use:  No   .  Drug Use:  Not on file   .  Sexually Active:  Not on file       Other Topics  Concern   .  Not on file       Social History Narrative   .  No narrative on file        ROS:   General: Negative for anorexia, weight loss, fever, chills, fatigue, weakness. Eyes: Negative for vision changes.   ENT: Negative for hoarseness, difficulty swallowing , nasal congestion. CV: Negative for chest pain, angina, palpitations, dyspnea on exertion, peripheral edema.   Respiratory: Negative for dyspnea at rest, dyspnea on exertion, cough, sputum, wheezing.   GI: See history of present illness. GU:  Negative for dysuria, hematuria, urinary incontinence, urinary frequency, nocturnal urination.   MS: Negative for joint pain, low back pain.   Derm: Negative for rash or itching.   Neuro: Negative for weakness, abnormal sensation, seizure, frequent headaches, memory loss, confusion.   Psych: Negative for anxiety, depression, suicidal ideation, hallucinations.   Endo: Negative for unusual weight change.   Heme: Negative for  bruising or bleeding. Allergy: Negative for rash or hives.     Physical Examination:   BP 110/70  Pulse 68  Temp 96.6 F (35.9 C)  Ht 5\' 7"  (1.702 m)  Wt 129 lb 12.8 oz (58.877 kg)  BMI 20.33 kg/m2    General: Well-nourished, well-developed in no acute distress.   Head: Normocephalic, atraumatic.    Eyes: Conjunctiva pink, no icterus. Mouth: Oropharyngeal mucosa moist and pink , no lesions erythema or exudate. Neck: Supple without thyromegaly, masses, or lymphadenopathy.   Lungs: Clear to auscultation bilaterally.   Heart: Regular rate and rhythm, no murmurs rubs or gallops.   Abdomen: Bowel sounds are normal, nontender, nondistended, no hepatosplenomegaly or masses, no abdominal bruits or    hernia , no rebound or guarding.    Extremities: No lower extremity edema.   Neuro: Alert and oriented x 4 , grossly normal neurologically.   Skin: Warm and dry, no rash or jaundice.    Psych: Alert and cooperative, normal mood and affect.          Rebecca Cohen  02/27/2011  2:38 PM  Signed Cc to PCP        Family hx of colon cancer - Rebecca Coast, PA  02/27/2011  2:35 PM  Signed Family history of colon cancer, brother at age 69. Recommend high-risk screening colonoscopy. Procedure to be done with deep sedation with Propofol due to polypharmacy. In 2006 at time of EGD, she received Versed 6 mg, Demerol 125 mg. Patient states that she was not adequately sedated.  I have discussed the risks, alternatives, benefits with regards to but not limited to the risk of reaction to medication, bleeding, infection, perforation and the patient is agreeable to proceed. Written consent to be obtained.       Other constipation -  Rebecca Coast, PA  02/27/2011  2:35 PM  Signed Recommend daily fiber supplement.      I have seen the patient prior to the procedure(s) today and reviewed the history and physical / consultation from 02/27/11.  There have been no changes. After consideration of the risks,  benefits, alternatives and imponderables, the patient has consented to the procedure(s).

## 2011-03-14 NOTE — Anesthesia Postprocedure Evaluation (Signed)
  Anesthesia Post-op Note  Patient: Rebecca Cohen  Procedure(s) Performed:  COLONOSCOPY - In the cecum at 1332.  Total withdrawal time 8    minutes.  Patient Location: PACU  Anesthesia Type: MAC  Level of Consciousness: awake, alert  and oriented  Airway and Oxygen Therapy: Patient Spontanous Breathing  Post-op Pain: none  Post-op Assessment: Post-op Vital signs reviewed, Patient's Cardiovascular Status Stable and Respiratory Function Stable  Post-op Vital Signs: stable  Complications: No apparent anesthesia complications

## 2011-03-14 NOTE — Transfer of Care (Signed)
Immediate Anesthesia Transfer of Care Note  Patient: Rebecca Cohen  Procedure(s) Performed:  COLONOSCOPY - In the cecum at 1332.  Total withdrawal time 8    minutes.  Patient Location: PACU  Anesthesia Type: MAC  Level of Consciousness: alert , oriented and sedated  Airway & Oxygen Therapy: Patient Spontanous Breathing and Patient connected to face mask oxygen  Post-op Assessment: Report given to PACU RN  Post vital signs: stable  Complications: No apparent anesthesia complications

## 2011-03-14 NOTE — Anesthesia Preprocedure Evaluation (Addendum)
Anesthesia Evaluation  Name, MR# and DOB Patient awake  General Assessment Comment  Reviewed: Allergy & Precautions, H&P  and Patient's Chart, lab work & pertinent test results  History of Anesthesia Complications Negative for: history of anesthetic complications  Airway Mallampati: I      Dental  (+) Edentulous Upper and Partial Lower   Pulmonary  COPD COPD inhaler  clear to auscultation    Cardiovascular Regular    Neuro/Psych (+) {AN ROS/MED HX NEURO HEADACHES (+) Depression,   GI/Hepatic/Renal (+)  GERD Medicated     Endo/Other   Abdominal   Musculoskeletal  Hematology   Peds  Reproductive/Obstetrics   Anesthesia Other Findings             Anesthesia Physical Anesthesia Plan  ASA: III  Anesthesia Plan: MAC   Post-op Pain Management:    Induction:   Airway Management Planned: Simple Face Mask  Additional Equipment:   Intra-op Plan:   Post-operative Plan:   Informed Consent: I have reviewed the patients History and Physical, chart, labs and discussed the procedure including the risks, benefits and alternatives for the proposed anesthesia with the patient or authorized representative who has indicated his/her understanding and acceptance.     Plan Discussed with:   Anesthesia Plan Comments:         Anesthesia Quick Evaluation

## 2011-03-21 ENCOUNTER — Encounter (HOSPITAL_COMMUNITY): Payer: Self-pay | Admitting: Internal Medicine

## 2014-02-10 ENCOUNTER — Other Ambulatory Visit (HOSPITAL_COMMUNITY): Payer: Self-pay | Admitting: Internal Medicine

## 2014-02-10 DIAGNOSIS — I739 Peripheral vascular disease, unspecified: Secondary | ICD-10-CM

## 2014-02-14 ENCOUNTER — Ambulatory Visit (HOSPITAL_COMMUNITY): Payer: Managed Care, Other (non HMO)

## 2014-02-15 ENCOUNTER — Ambulatory Visit (HOSPITAL_COMMUNITY): Admission: RE | Admit: 2014-02-15 | Payer: Managed Care, Other (non HMO) | Source: Ambulatory Visit

## 2014-02-21 ENCOUNTER — Other Ambulatory Visit (HOSPITAL_COMMUNITY): Payer: Self-pay | Admitting: Internal Medicine

## 2014-02-21 ENCOUNTER — Ambulatory Visit (HOSPITAL_COMMUNITY)
Admission: RE | Admit: 2014-02-21 | Discharge: 2014-02-21 | Disposition: A | Discharge status: Home / Self Care | Payer: Managed Care, Other (non HMO) | Source: Ambulatory Visit | Attending: Internal Medicine | Admitting: Internal Medicine

## 2014-02-21 DIAGNOSIS — M79609 Pain in unspecified limb: Secondary | ICD-10-CM | POA: Insufficient documentation

## 2014-02-21 DIAGNOSIS — I739 Peripheral vascular disease, unspecified: Secondary | ICD-10-CM

## 2014-09-22 ENCOUNTER — Encounter (HOSPITAL_COMMUNITY): Payer: Self-pay | Admitting: *Deleted

## 2014-09-22 ENCOUNTER — Emergency Department (HOSPITAL_COMMUNITY): Payer: Managed Care, Other (non HMO)

## 2014-09-22 ENCOUNTER — Inpatient Hospital Stay (HOSPITAL_COMMUNITY)
Admission: EM | Admit: 2014-09-22 | Discharge: 2014-09-25 | DRG: 872 | Disposition: A | Discharge status: Home / Self Care | Payer: Managed Care, Other (non HMO) | Attending: Internal Medicine | Admitting: Internal Medicine

## 2014-09-22 DIAGNOSIS — D649 Anemia, unspecified: Secondary | ICD-10-CM | POA: Diagnosis present

## 2014-09-22 DIAGNOSIS — J449 Chronic obstructive pulmonary disease, unspecified: Secondary | ICD-10-CM | POA: Diagnosis present

## 2014-09-22 DIAGNOSIS — D72829 Elevated white blood cell count, unspecified: Secondary | ICD-10-CM | POA: Diagnosis present

## 2014-09-22 DIAGNOSIS — Z90710 Acquired absence of both cervix and uterus: Secondary | ICD-10-CM

## 2014-09-22 DIAGNOSIS — Z800 Family history of malignant neoplasm of digestive organs: Secondary | ICD-10-CM

## 2014-09-22 DIAGNOSIS — L03317 Cellulitis of buttock: Secondary | ICD-10-CM | POA: Diagnosis present

## 2014-09-22 DIAGNOSIS — F17210 Nicotine dependence, cigarettes, uncomplicated: Secondary | ICD-10-CM | POA: Diagnosis present

## 2014-09-22 DIAGNOSIS — D6489 Other specified anemias: Secondary | ICD-10-CM | POA: Diagnosis present

## 2014-09-22 DIAGNOSIS — A419 Sepsis, unspecified organism: Principal | ICD-10-CM | POA: Diagnosis present

## 2014-09-22 DIAGNOSIS — Z23 Encounter for immunization: Secondary | ICD-10-CM

## 2014-09-22 DIAGNOSIS — E785 Hyperlipidemia, unspecified: Secondary | ICD-10-CM | POA: Diagnosis present

## 2014-09-22 DIAGNOSIS — K219 Gastro-esophageal reflux disease without esophagitis: Secondary | ICD-10-CM | POA: Diagnosis present

## 2014-09-22 LAB — COMPREHENSIVE METABOLIC PANEL
ALT: 46 U/L — ABNORMAL HIGH (ref 0–35)
AST: 25 U/L (ref 0–37)
Albumin: 3.5 g/dL (ref 3.5–5.2)
Alkaline Phosphatase: 141 U/L — ABNORMAL HIGH (ref 39–117)
Anion gap: 6 (ref 5–15)
BUN: 9 mg/dL (ref 6–23)
CO2: 24 mmol/L (ref 19–32)
Calcium: 9.5 mg/dL (ref 8.4–10.5)
Chloride: 108 mmol/L (ref 96–112)
Creatinine, Ser: 0.83 mg/dL (ref 0.50–1.10)
GFR calc Af Amer: 90 mL/min — ABNORMAL LOW (ref >90)
GFR calc non Af Amer: 77 mL/min — ABNORMAL LOW (ref >90)
Glucose, Bld: 152 mg/dL — ABNORMAL HIGH (ref 70–99)
Potassium: 3.9 mmol/L (ref 3.5–5.1)
Sodium: 138 mmol/L (ref 135–145)
Total Bilirubin: 0.6 mg/dL (ref 0.3–1.2)
Total Protein: 7.6 g/dL (ref 6.0–8.3)

## 2014-09-22 LAB — CBC WITH DIFFERENTIAL/PLATELET
Basophils Absolute: 0 10*3/uL (ref 0.0–0.1)
Basophils Relative: 0 % (ref 0–1)
Eosinophils Absolute: 0.2 10*3/uL (ref 0.0–0.7)
Eosinophils Relative: 1 % (ref 0–5)
HCT: 35.9 % — ABNORMAL LOW (ref 36.0–46.0)
Hemoglobin: 11.8 g/dL — ABNORMAL LOW (ref 12.0–15.0)
Lymphocytes Relative: 8 % — ABNORMAL LOW (ref 12–46)
Lymphs Abs: 2 10*3/uL (ref 0.7–4.0)
MCH: 30.8 pg (ref 26.0–34.0)
MCHC: 32.9 g/dL (ref 30.0–36.0)
MCV: 93.7 fL (ref 78.0–100.0)
Monocytes Absolute: 2.2 10*3/uL — ABNORMAL HIGH (ref 0.1–1.0)
Monocytes Relative: 10 % (ref 3–12)
Neutro Abs: 18.9 10*3/uL — ABNORMAL HIGH (ref 1.7–7.7)
Neutrophils Relative %: 81 % — ABNORMAL HIGH (ref 43–77)
Platelets: 220 10*3/uL (ref 150–400)
RBC: 3.83 MIL/uL — ABNORMAL LOW (ref 3.87–5.11)
RDW: 12.4 % (ref 11.5–15.5)
WBC: 23.3 10*3/uL — ABNORMAL HIGH (ref 4.0–10.5)

## 2014-09-22 MED ORDER — PNEUMOCOCCAL VAC POLYVALENT 25 MCG/0.5ML IJ INJ
0.5000 mL | INJECTION | INTRAMUSCULAR | Status: AC
  Administered 2014-09-23: 0.5 mL via INTRAMUSCULAR
  Filled 2014-09-22: qty 0.5

## 2014-09-22 MED ORDER — ALBUTEROL SULFATE 2 MG PO TABS
2.0000 mg | ORAL_TABLET | Freq: Three times a day (TID) | ORAL | Status: DC | PRN
Start: 2014-09-22 — End: 2014-09-25
  Filled 2014-09-22: qty 2

## 2014-09-22 MED ORDER — DIPHENHYDRAMINE HCL 25 MG PO CAPS: 25.0000 mg | ORAL_CAPSULE | Freq: Every evening | ORAL | Status: DC | PRN

## 2014-09-22 MED ORDER — MORPHINE SULFATE 4 MG/ML IJ SOLN
4.0000 mg | Freq: Once | INTRAMUSCULAR | Status: AC
  Administered 2014-09-22: 4 mg via INTRAVENOUS
  Filled 2014-09-22: qty 1

## 2014-09-22 MED ORDER — ONDANSETRON HCL 4 MG/2ML IJ SOLN: 4.0000 mg | Freq: Four times a day (QID) | INTRAMUSCULAR | Status: DC | PRN

## 2014-09-22 MED ORDER — ACYCLOVIR 200 MG PO CAPS
400.0000 mg | ORAL_CAPSULE | Freq: Every day | ORAL | Status: DC
  Administered 2014-09-23 – 2014-09-25 (×12): 400 mg via ORAL
  Filled 2014-09-22 (×11): qty 2

## 2014-09-22 MED ORDER — PIPERACILLIN-TAZOBACTAM 3.375 G IVPB
3.3750 g | Freq: Three times a day (TID) | INTRAVENOUS | Status: DC
  Administered 2014-09-23 – 2014-09-25 (×8): 3.375 g via INTRAVENOUS
  Filled 2014-09-22 (×11): qty 50

## 2014-09-22 MED ORDER — B-12 100 MCG PO TABS
1.0000 | ORAL_TABLET | Freq: Every day | ORAL | Status: DC
  Filled 2014-09-22 (×2): qty 1

## 2014-09-22 MED ORDER — SODIUM CHLORIDE 0.9 % IJ SOLN: 3.0000 mL | INTRAMUSCULAR | Status: DC | PRN

## 2014-09-22 MED ORDER — SODIUM CHLORIDE 0.9 % IV BOLUS (SEPSIS)
1000.0000 mL | Freq: Once | INTRAVENOUS | Status: AC
  Administered 2014-09-22: 1000 mL via INTRAVENOUS

## 2014-09-22 MED ORDER — ONDANSETRON HCL 4 MG PO TABS: 4.0000 mg | ORAL_TABLET | Freq: Four times a day (QID) | ORAL | Status: DC | PRN

## 2014-09-22 MED ORDER — VANCOMYCIN HCL 10 G IV SOLR
1250.0000 mg | Freq: Once | INTRAVENOUS | Status: AC
  Administered 2014-09-22: 1250 mg via INTRAVENOUS
  Filled 2014-09-22: qty 1250

## 2014-09-22 MED ORDER — VANCOMYCIN HCL IN DEXTROSE 750-5 MG/150ML-% IV SOLN
750.0000 mg | Freq: Two times a day (BID) | INTRAVENOUS | Status: DC
Start: 2014-09-23 — End: 2014-09-25
  Administered 2014-09-23 – 2014-09-25 (×5): 750 mg via INTRAVENOUS
  Filled 2014-09-22 (×7): qty 150

## 2014-09-22 MED ORDER — IOHEXOL 300 MG/ML  SOLN
100.0000 mL | Freq: Once | INTRAMUSCULAR | Status: AC | PRN
  Administered 2014-09-22: 100 mL via INTRAVENOUS

## 2014-09-22 MED ORDER — ACYCLOVIR 400 MG PO TABS
400.0000 mg | ORAL_TABLET | Freq: Every day | ORAL | Status: DC
  Filled 2014-09-22 (×3): qty 1

## 2014-09-22 MED ORDER — VANCOMYCIN HCL IN DEXTROSE 750-5 MG/150ML-% IV SOLN
INTRAVENOUS | Status: AC
  Filled 2014-09-22: qty 150

## 2014-09-22 MED ORDER — PIPERACILLIN-TAZOBACTAM 3.375 G IVPB 30 MIN
3.3750 g | Freq: Once | INTRAVENOUS | Status: AC
  Administered 2014-09-22: 3.375 g via INTRAVENOUS
  Filled 2014-09-22: qty 50

## 2014-09-22 MED ORDER — HYDROCODONE-ACETAMINOPHEN 5-325 MG PO TABS
1.5000 | ORAL_TABLET | Freq: Four times a day (QID) | ORAL | Status: DC | PRN
  Administered 2014-09-22 – 2014-09-25 (×7): 1.5 via ORAL
  Filled 2014-09-22 (×7): qty 2

## 2014-09-22 MED ORDER — HEPARIN SODIUM (PORCINE) 5000 UNIT/ML IJ SOLN
5000.0000 [IU] | Freq: Three times a day (TID) | INTRAMUSCULAR | Status: DC
  Administered 2014-09-22 – 2014-09-25 (×8): 5000 [IU] via SUBCUTANEOUS
  Filled 2014-09-22 (×8): qty 1

## 2014-09-22 MED ORDER — MIRTAZAPINE 15 MG PO TABS
15.0000 mg | ORAL_TABLET | Freq: Every day | ORAL | Status: DC
  Administered 2014-09-22 – 2014-09-24 (×3): 15 mg via ORAL
  Filled 2014-09-22 (×6): qty 1

## 2014-09-22 MED ORDER — LORAZEPAM 1 MG PO TABS
2.0000 mg | ORAL_TABLET | Freq: Four times a day (QID) | ORAL | Status: DC | PRN
  Administered 2014-09-22 – 2014-09-24 (×4): 2 mg via ORAL
  Filled 2014-09-22 (×4): qty 2

## 2014-09-22 MED ORDER — ROPINIROLE HCL 0.25 MG PO TABS
0.2500 mg | ORAL_TABLET | Freq: Every day | ORAL | Status: DC
  Administered 2014-09-23 – 2014-09-25 (×3): 0.25 mg via ORAL
  Filled 2014-09-22 (×5): qty 1

## 2014-09-22 MED ORDER — SODIUM CHLORIDE 0.9 % IV SOLN
250.0000 mL | INTRAVENOUS | Status: DC | PRN
  Administered 2014-09-23: 250 mL via INTRAVENOUS

## 2014-09-22 MED ORDER — ONDANSETRON HCL 4 MG/2ML IJ SOLN
4.0000 mg | Freq: Once | INTRAMUSCULAR | Status: AC
  Administered 2014-09-22: 4 mg via INTRAMUSCULAR
  Filled 2014-09-22: qty 2

## 2014-09-22 MED ORDER — CALCIUM CARBONATE 1250 (500 CA) MG PO TABS
1.0000 | ORAL_TABLET | Freq: Two times a day (BID) | ORAL | Status: DC
  Administered 2014-09-23 – 2014-09-25 (×5): 500 mg via ORAL
  Filled 2014-09-22 (×8): qty 1

## 2014-09-22 MED ORDER — PIPERACILLIN-TAZOBACTAM 3.375 G IVPB
INTRAVENOUS | Status: AC
  Filled 2014-09-22: qty 50

## 2014-09-22 MED ORDER — ACETAMINOPHEN 500 MG PO TABS
1000.0000 mg | ORAL_TABLET | Freq: Once | ORAL | Status: AC
  Administered 2014-09-22: 1000 mg via ORAL
  Filled 2014-09-22: qty 2

## 2014-09-22 MED ORDER — SODIUM CHLORIDE 0.9 % IJ SOLN
3.0000 mL | Freq: Two times a day (BID) | INTRAMUSCULAR | Status: DC
Start: 2014-09-22 — End: 2014-09-25
  Administered 2014-09-24 – 2014-09-25 (×2): 3 mL via INTRAVENOUS

## 2014-09-22 NOTE — Progress Notes (Signed)
ANTIBIOTIC CONSULT NOTE - INITIAL  Pharmacy Consult for Vancomycin & Zosyn Indication: cellulitis  Allergies  Allergen Reactions  . Bextra [Valdecoxib] Anaphylaxis  . Sulfa Drugs Cross Reactors Anaphylaxis and Rash    Patient Measurements: Height: 5\' 7"  (170.2 cm) Weight: 145 lb (65.772 kg) IBW/kg (Calculated) : 61.6 Adjusted Body Weight:   Vital Signs: Temp: 98 F (36.7 C) (02/04 1702) Temp Source: Oral (02/04 1702) BP: 96/62 mmHg (02/04 1702) Pulse Rate: 101 (02/04 1702) Intake/Output from previous day:   Intake/Output from this shift:    Labs:  Recent Labs  09/22/14 1349  WBC 23.3*  HGB 11.8*  PLT 220  CREATININE 0.83   Estimated Creatinine Clearance: 73.6 mL/min (by C-G formula based on Cr of 0.83). No results for input(s): VANCOTROUGH, VANCOPEAK, VANCORANDOM, GENTTROUGH, GENTPEAK, GENTRANDOM, TOBRATROUGH, TOBRAPEAK, TOBRARND, AMIKACINPEAK, AMIKACINTROU, AMIKACIN in the last 72 hours.   Microbiology: No results found for this or any previous visit (from the past 720 hour(s)).  Medical History: Past Medical History  Diagnosis Date  . Depression   . Osteopenia     previously on Fosamax  . COPD (chronic obstructive pulmonary disease)   . Hyperlipidemia   . GERD (gastroesophageal reflux disease)     pt sts sfter egd with ed and diet change is on no meds and has no more problems with this    Medications:   (Not in a hospital admission) Assessment: 59 year old lady presented with a 2 to 3 day history of pain and swelling on her right buttock, noticed a boil which seem to get bigger and bigger and then surrounding redness. She denies any trauma to the area or any insect bite. Vancomycin 1250 mg IV and Zosyn 3.375 GM IV given in ED  Goal of Therapy:  Vancomycin trough level 10-15 mcg/ml Gentamicin trough level <2 mcg/ml  Plan:  Zosyn 3.375 GM IV every 8 hours, infuse each dose over 4 hours Vancomycin 750 mg IV every 12 hours Vancomycin trough at  steady state Monitor renal function Labs per protocol  Raquel JamesPittman, Anh Bigos Bennett 09/22/2014,9:22 PM

## 2014-09-22 NOTE — ED Notes (Signed)
Pt ambulated to bathroom 

## 2014-09-22 NOTE — ED Notes (Signed)
Body aches, nausea, no vomiting, says she has a "boil" on buttock

## 2014-09-22 NOTE — ED Notes (Addendum)
Attempted to call report, nurse unable at this time and will call back per nurse secretary Kathie RhodesBetty

## 2014-09-22 NOTE — ED Notes (Signed)
MD at bedside. 

## 2014-09-22 NOTE — ED Provider Notes (Signed)
CSN: [id number]     Arrival date & time 09/22/14  1224 History  This chart was scribed for Raeford RazorStephen Pasqual Farias, MD by Tonye RoyaltyJoshua Chen, ED Scribe. This patient was seen in room [facility] and the patient's care was started at 3:57 PM.     Chief Complaint  Patient presents with  . Abscess   The history is provided by the patient. No language interpreter was used.   HPI Comments: Rebecca Cohen is a 59 y.o. female who presents to the Emergency Department complaining of abscess to her buttock with onset 2 days ago. She reports associated fever with onset soon after she noticed the boil. She reports nausea but no vomiting. She states she has not had any abscesses requiring incision and drainage before. She states she applied a cream to it that seemed to make it worse. She denies history of diabetes or recent steroid use.   Past Medical History  Diagnosis Date  . Depression   . Osteopenia     previously on Fosamax  . COPD (chronic obstructive pulmonary disease)   . Hyperlipidemia   . GERD (gastroesophageal reflux disease)     pt sts sfter egd with ed and diet change is on no meds and has no more problems with this   Past Surgical History  Procedure Laterality Date  . S/p hysterectomy    . Cervical disc surgery    . 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  . Abdominal hysterectomy      1990's  . Colonoscopy  03/14/2011    Procedure: COLONOSCOPY;  Surgeon: Corbin Adeobert M Rourk, MD;  Location: [facility];  Service: Gastroenterology;  Laterality: N/A;  In the cecum at 1332.  Total withdrawal time 8    minutes.   Family History  Problem Relation Age of Onset  . Colon cancer Brother 50    deceased  . Anesthesia problems Neg Hx   . Hypotension Neg Hx   . Malignant hyperthermia Neg Hx   . Pseudochol deficiency Neg Hx    History  Substance Use Topics  . Smoking status: Current Every Day Smoker -- 0.25 packs/day for 20 years    Types: Cigarettes  .  Smokeless tobacco: Not on file     Comment: 1/2 pack a day  . Alcohol Use: No   OB History    No data available     Review of Systems  Constitutional: Positive for fever.  Gastrointestinal: Positive for nausea. Negative for vomiting.  Skin:       abscess  All other systems reviewed and are negative.     Allergies  Bextra and Sulfa drugs cross reactors  Home Medications   Prior to Admission medications   Medication Sig Start Date End Date Taking? Authorizing Provider  acyclovir (ZOVIRAX) 400 MG tablet Take 400 mg by mouth every 4 (four) hours while awake. One tablet five times daily     Historical Provider, MD  albuterol (PROVENTIL) 2 MG tablet Take 2 mg by mouth 3 (three) times daily. One - two tablets daily     Historical Provider, MD  amoxicillin-clavulanate (AUGMENTIN) 875-125 MG per tablet Take 1 tablet by mouth 2 (two) times daily. Patient last dose will be today 02-28-11    Historical Provider, MD  calcium carbonate (OS-CAL) 600 MG TABS Take 600 mg by mouth 2 (two) times daily with a meal.      Historical Provider, MD  citalopram (CELEXA) 20 MG tablet Take  20 mg by mouth daily.      Historical Provider, MD  Cyanocobalamin (B-12) 100 MCG TABS Take by mouth 1 day or 1 dose.      Historical Provider, MD  diphenhydrAMINE (SOMINEX) 25 MG tablet Take 25 mg by mouth at bedtime as needed.      Historical Provider, MD  HYDROcodone-acetaminophen (LORTAB) 7.5-500 MG per tablet Take 1 tablet by mouth every 6 (six) hours as needed. pain    Historical Provider, MD  LORazepam (ATIVAN) 2 MG tablet Take 2 mg by mouth every 6 (six) hours as needed. One tablet 3-4 times daily as needed for pain    Historical Provider, MD  simvastatin (ZOCOR) 10 MG tablet Take 10 mg by mouth at bedtime.      Historical Provider, MD   BP 110/65 mmHg  Pulse 126  Temp(Src) 98.6 F (37 C) (Oral)  Resp 22  Ht  (1.702 m)  Wt 145 lb (65.772 kg)  BMI 22.71 kg/m2  SpO2 95%   Physical Exam  Constitutional:  She is oriented to person, place, and time. She appears well-developed and well-nourished. No distress.  HENT:  Head: Normocephalic and atraumatic.  Eyes: EOM are normal.  Neck: Normal range of motion.  Cardiovascular: Normal rate, regular rhythm and normal heart sounds.   Pulmonary/Chest: Effort normal and breath sounds normal.  Abdominal: Soft. She exhibits no distension. There is no tenderness.  Genitourinary:  Extensive cellulitis R buttock. Induration maximal around gluteal cleft and extending almost to anus. Exam limited by significant pain even with gentle traction of buttocks,   Musculoskeletal: Normal range of motion.  Neurological: She is alert and oriented to person, place, and time.  Skin: Skin is warm and dry.  Psychiatric: She has a normal mood and affect. Judgment normal.  Nursing note and vitals reviewed.   ED Course  Procedures   DIAGNOSTIC STUDIES: Oxygen Saturation is 95% on room air, normal by my interpretation.    COORDINATION OF CARE: 4:02 PM Discussed treatment plan with patient at beside. The patient agrees with the plan and has no further questions at this time.  Labs Review Labs Reviewed  CBC WITH DIFFERENTIAL/PLATELET - Abnormal; Notable for the following:    WBC 23.3 (*)    RBC 3.83 (*)    Hemoglobin 11.8 (*)    HCT 35.9 (*)    Neutrophils Relative % 81 (*)    Neutro Abs 18.9 (*)    Lymphocytes Relative 8 (*)    Monocytes Absolute 2.2 (*)    All other components within normal limits  COMPREHENSIVE METABOLIC PANEL - Abnormal; Notable for the following:    Glucose, Bld 152 (*)    ALT 46 (*)    Alkaline Phosphatase 141 (*)    GFR calc non Af Amer 77 (*)    GFR calc Af Amer 90 (*)    All other components within normal limits    Imaging Review No results found.   EKG Interpretation None      MDM   Final diagnoses:  None   58yF with likely abscess and cellulitis involving large portion of R buttock. Some systemic symptoms of subjective  fever, nausea and is tachycardic. IV, pain meds, abx. Will CT to better evaluate extent.   CT w/o evidence of drainable collection. No fluctuance on exam. With extent of involvement, I feel admission is prudent.   I personally preformed the services scribed in my presence. The recorded information has been reviewed is accurate. Jeannett Senior  Juleen China, MD.   Raeford Razor, MD 09/29/14 [phone]

## 2014-09-22 NOTE — H&P (Signed)
[facility] History and Physical  Rebecca ClimesRita C Ezra UXL:[id number] DOB: 02/19/1956 DOA: 09/22/2014  Referring physician: ER PCP: Cassell SmilesFUSCO,LAWRENCE J., MD   Chief Complaint: Pain/swelling right buttock.  HPI: Rebecca Cohen is a 59 y.o. female  This is a 59 year old lady, who is not diabetic, who presented with a 2 to three-day history of pain and swelling on her right buttock. She initially noticed a boil which seem to get bigger and bigger and then surrounding redness. She denies any trauma to the area or any insect bite. She also felt feverish. She did not use any recent steroids. She denies any previous abscesses. Evaluation in the emergency room showed her to have a clinical large abscess/cellulitis in the right buttock area. CT scan does not show anything that requires incision and drainage. She is now being admitted for intravenous antibiotics and further management.   Review of Systems:  Apart from symptoms above, all systems negative.  Past Medical History  Diagnosis Date  . Depression   . Osteopenia     previously on Fosamax  . COPD (chronic obstructive pulmonary disease)   . Hyperlipidemia   . GERD (gastroesophageal reflux disease)     pt sts sfter egd with ed and diet change is on no meds and has no more problems with this   Past Surgical History  Procedure Laterality Date  . S/p hysterectomy    . Cervical disc surgery    . 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  . Abdominal hysterectomy      1990's  . Colonoscopy  03/14/2011    Procedure: COLONOSCOPY;  Surgeon: Corbin Adeobert M Rourk, MD;  Location: [facility];  Service: Gastroenterology;  Laterality: N/A;  In the cecum at 1332.  Total withdrawal time 8    minutes.   Social History:  reports that she has been smoking Cigarettes.  She has a 5 pack-year smoking history. She does not have any smokeless tobacco history on file. She reports that she does not drink alcohol or  use illicit drugs.  Allergies  Allergen Reactions  . Bextra [Valdecoxib] Anaphylaxis  . Sulfa Drugs Cross Reactors Anaphylaxis and Rash    Family History  Problem Relation Age of Onset  . Colon cancer Brother 50    deceased  . Anesthesia problems Neg Hx   . Hypotension Neg Hx   . Malignant hyperthermia Neg Hx   . Pseudochol deficiency Neg Hx       Prior to Admission medications   Medication Sig Start Date End Date Taking? Authorizing Provider  acyclovir (ZOVIRAX) 400 MG tablet Take 400 mg by mouth 5 (five) times daily.    Yes Historical Provider, MD  albuterol (PROVENTIL) 2 MG tablet Take 2-4 mg by mouth 3 (three) times daily as needed for wheezing or shortness of breath.    Yes Historical Provider, MD  calcium carbonate (OS-CAL) 600 MG TABS Take 600 mg by mouth 2 (two) times daily with a meal.     Yes Historical Provider, MD  Cyanocobalamin (B-12) 100 MCG TABS Take 1 tablet by mouth daily.    Yes Historical Provider, MD  HYDROcodone-acetaminophen (NORCO) 7.5-325 MG per tablet Take 1 tablet by mouth 4 (four) times daily as needed (pain).  09/04/14  Yes Historical Provider, MD  LORazepam (ATIVAN) 2 MG tablet Take 2 mg by mouth 4 (four) times daily as needed for anxiety.    Yes Historical Provider, MD  mirtazapine (REMERON) 15 MG tablet Take  15 mg by mouth at bedtime. 09/11/14  Yes Historical Provider, MD  rOPINIRole (REQUIP) 0.25 MG tablet Take 0.25 mg by mouth daily.   Yes Historical Provider, MD  diphenhydrAMINE (SOMINEX) 25 MG tablet Take 25 mg by mouth at bedtime as needed for sleep.     Historical Provider, MD   Physical Exam: Filed Vitals:   09/22/14 1228 09/22/14 1702  BP: 110/65 96/62  Pulse: 126 101  Temp: 98.6 F (37 C) 98 F (36.7 C)  TempSrc: Oral Oral  Resp: 22 14  Height:  (1.702 m)   Weight: 65.772 kg (145 lb)   SpO2: 95% 98%    Wt Readings from Last 3 Encounters:  09/22/14 65.772 kg (145 lb)  03/14/11 58.968 kg (130 lb)  03/12/11 58.968 kg (130 lb)     General:  Appears calm and comfortable. She does not look toxic or septic. Eyes: PERRL, normal lids, irises & conjunctiva ENT: grossly normal hearing, lips & tongue Neck: no LAD, masses or thyromegaly Cardiovascular: RRR, no m/r/g. No LE edema. Telemetry: SR, no arrhythmias  Respiratory: CTA bilaterally, no w/r/r. Normal respiratory effort. Abdomen: soft, ntnd Skin: Virtually the whole of her right buttock is covered with cellulitis, there is a firm area in the middle but no clear fluctuant area that would be amenable to incision and drainage. Musculoskeletal: grossly normal tone BUE/BLE Psychiatric: grossly normal mood and affect, speech fluent and appropriate Neurologic: grossly non-focal.          Labs on Admission:  Basic Metabolic Panel:  Recent Labs Lab 09/22/14 1349  NA 138  K 3.9  CL 108  CO2 24  GLUCOSE 152*  BUN 9  CREATININE 0.83  CALCIUM 9.5   Liver Function Tests:  Recent Labs Lab 09/22/14 1349  AST 25  ALT 46*  ALKPHOS 141*  BILITOT 0.6  PROT 7.6  ALBUMIN 3.5   No results for input(s): LIPASE, AMYLASE in the last 168 hours. No results for input(s): AMMONIA in the last 168 hours. CBC:  Recent Labs Lab 09/22/14 1349  WBC 23.3*  NEUTROABS 18.9*  HGB 11.8*  HCT 35.9*  MCV 93.7  PLT 220   Cardiac Enzymes: No results for input(s): CKTOTAL, CKMB, CKMBINDEX, TROPONINI in the last 168 hours.  BNP (last 3 results) No results for input(s): BNP in the last 8760 hours.  ProBNP (last 3 results) No results for input(s): PROBNP in the last 8760 hours.  CBG: No results for input(s): GLUCAP in the last 168 hours.  Radiological Exams on Admission: Ct Pelvis W Contrast  09/22/2014   CLINICAL DATA:  Right gluteal abscess.  EXAM: CT PELVIS WITH CONTRAST  TECHNIQUE: Multidetector CT imaging of the pelvis was performed using the standard protocol following the bolus administration of intravenous contrast.  CONTRAST:  OMNIPAQUE IOHEXOL 300 MG/ML   SOLN  COMPARISON:  12/19/2004 abdominal CT  FINDINGS: There is gas and subcutaneous fat edema in the medial right gluteal region, likely an opened abscess. No spreading subcutaneous gas typical of a necrotizing infection. No opaque foreign body. There is no rim enhancing fluid collection to suggest mature abscess. No visible fistula to the anus, although MRI is better for this purpose if there is clinical suspicion for fistula. No supralevator involvement.  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 Limited lower abdominal imaging is negative for acute abnormality.  IMPRESSION: Right gluteal cellulitis with small volume gas. No mature abscess or supralevator inflammation.   Electronically Signed   By:  Tiburcio Pea M.D.   On: 09/22/2014 17:45      Assessment/Plan   1. Cellulitis of the right buttock. Although she does not require incision and drainage, the area is very large and will require broad-spectrum antibiotics to cover also for anaerobic organisms. I will treat her with intravenous vancomycin and Zosyn.  Further recommendations will depend on patient's hospital progress.   Code Status: Full code.   DVT Prophylaxis: Heparin.  Family Communication: I discussed the plan with the patient at the bedside.  Disposition Plan: Home when medically stable.   Time spent: 45 minutes.  Wilson Singer [facility] Pager [phone].

## 2014-09-23 DIAGNOSIS — D72829 Elevated white blood cell count, unspecified: Secondary | ICD-10-CM | POA: Diagnosis present

## 2014-09-23 DIAGNOSIS — D6489 Other specified anemias: Secondary | ICD-10-CM | POA: Diagnosis present

## 2014-09-23 LAB — COMPREHENSIVE METABOLIC PANEL
ALK PHOS: 117 U/L (ref 39–117)
ALT: 33 U/L (ref 0–35)
AST: 25 U/L (ref 0–37)
Albumin: 2.7 g/dL — ABNORMAL LOW (ref 3.5–5.2)
Anion gap: 5 (ref 5–15)
BILIRUBIN TOTAL: 0.4 mg/dL (ref 0.3–1.2)
BUN: 9 mg/dL (ref 6–23)
CALCIUM: 8.4 mg/dL (ref 8.4–10.5)
CO2: 22 mmol/L (ref 19–32)
Chloride: 110 mmol/L (ref 96–112)
Creatinine, Ser: 0.77 mg/dL (ref 0.50–1.10)
GFR calc non Af Amer: >90 mL/min (ref >90)
Glucose, Bld: 129 mg/dL — ABNORMAL HIGH (ref 70–99)
Potassium: 3.7 mmol/L (ref 3.5–5.1)
Sodium: 137 mmol/L (ref 135–145)
Total Protein: 6 g/dL (ref 6.0–8.3)

## 2014-09-23 LAB — CBC
HCT: 29.3 % — ABNORMAL LOW (ref 36.0–46.0)
Hemoglobin: 9.7 g/dL — ABNORMAL LOW (ref 12.0–15.0)
MCH: 31 pg (ref 26.0–34.0)
MCHC: 33.1 g/dL (ref 30.0–36.0)
MCV: 93.6 fL (ref 78.0–100.0)
PLATELETS: 213 10*3/uL (ref 150–400)
RBC: 3.13 MIL/uL — ABNORMAL LOW (ref 3.87–5.11)
RDW: 12.6 % (ref 11.5–15.5)
WBC: 19.6 10*3/uL — AB (ref 4.0–10.5)

## 2014-09-23 LAB — TSH: TSH: 1.793 u[IU]/mL (ref 0.350–4.500)

## 2014-09-23 MED ORDER — MORPHINE SULFATE 2 MG/ML IJ SOLN: 2.0000 mg | INTRAMUSCULAR | Status: DC | PRN

## 2014-09-23 MED ORDER — VITAMIN B-12 100 MCG PO TABS
100.0000 ug | ORAL_TABLET | Freq: Every day | ORAL | Status: DC
  Administered 2014-09-23 – 2014-09-25 (×3): 100 ug via ORAL
  Filled 2014-09-23 (×4): qty 1

## 2014-09-23 MED ORDER — SODIUM CHLORIDE 0.9 % IV BOLUS (SEPSIS)
500.0000 mL | Freq: Once | INTRAVENOUS | Status: AC
  Administered 2014-09-23: 500 mL via INTRAVENOUS

## 2014-09-23 NOTE — Progress Notes (Signed)
[facility] PROGRESS NOTE  Rebecca Cohen JYN:[id number] DOB: 07/06/1956 DOA: 09/22/2014 PCP: Rebecca Cohen Interim summary: 59 year old lady admitted for right buttock cellulitis.  Assessment/Plan: 1. sepsis secondary to Cellulitis of the right buttock: no abscess seen on CT , on IV antibiotics.  2. Anemia: unclear etiology, anemia panel ordered and stool for occult blood ordered.  3. Leukocytosis: possibly from the cellulitis.   Code Status: full code Family Communication: none at bedside Disposition Plan: home after PT EVAL.    Consultants:  none  Procedures:  CT pelvis  Antibiotics:  Vancomycin   zosyn  HPI/Subjective: Pain is better controlled.   Objective: Filed Vitals:   09/23/14 0609  BP: 104/47  Pulse: 99  Temp: 99.3 F (37.4 C)  Resp: 18   No intake or output data in the 24 hours ending 09/23/14 1756 Filed Weights   09/22/14 1228 09/22/14 2228  Weight: 65.772 kg (145 lb) 61.326 kg (135 lb 3.2 oz)    Exam:   General:  Alert afebrile comfortable  Cardiovascular: s1s2  Respiratory: ctab  Abdomen: soft nontender non distended bowel sounds heard  Musculoskeletal: bandage over the right buttock.   Data Reviewed: Basic Metabolic Panel:  Recent Labs Lab 09/22/14 1349 09/23/14 0616  NA 138 137  K 3.9 3.7  CL 108 110  CO2 24 22  GLUCOSE 152* 129*  BUN 9 9  CREATININE 0.83 0.77  CALCIUM 9.5 8.4   Liver Function Tests:  Recent Labs Lab 09/22/14 1349 09/23/14 0616  AST 25 25  ALT 46* 33  ALKPHOS 141* 117  BILITOT 0.6 0.4  PROT 7.6 6.0  ALBUMIN 3.5 2.7*   No results for input(s): LIPASE, AMYLASE in the last 168 hours. No results for input(s): AMMONIA in the last 168 hours. CBC:  Recent Labs Lab 09/22/14 1349 09/23/14 0616  WBC 23.3* 19.6*  NEUTROABS 18.9*  --   HGB 11.8* 9.7*  HCT 35.9* 29.3*  MCV 93.7 93.6  PLT 220 213   Cardiac Enzymes: No results for input(s): CKTOTAL, CKMB, CKMBINDEX, TROPONINI in  the last 168 hours. BNP (last 3 results) No results for input(s): BNP in the last 8760 hours.  ProBNP (last 3 results) No results for input(s): PROBNP in the last 8760 hours.  CBG: No results for input(s): GLUCAP in the last 168 hours.  No results found for this or any previous visit (from the past 240 hour(s)).   Studies: Ct Pelvis W Contrast  09/22/2014   CLINICAL DATA:  Right gluteal abscess.  EXAM: CT PELVIS WITH CONTRAST  TECHNIQUE: Multidetector CT imaging of the pelvis was performed using the standard protocol following the bolus administration of intravenous contrast.  CONTRAST:  100mL OMNIPAQUE IOHEXOL 300 MG/ML  SOLN  COMPARISON:  12/19/2004 abdominal CT  FINDINGS: There is gas and subcutaneous fat edema in the medial right gluteal region, likely an opened abscess. No spreading subcutaneous gas typical of a necrotizing infection. No opaque foreign body. There is no rim enhancing fluid collection to suggest mature abscess. No visible fistula to the anus, although MRI is better for this purpose if there is clinical suspicion for fistula. No supralevator involvement.  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 Limited lower abdominal imaging is negative for acute abnormality.  IMPRESSION: Right gluteal cellulitis with small volume gas. No mature abscess or supralevator inflammation.   Electronically Signed   By: Tiburcio PeaJonathan  Watts M.D.   On: 09/22/2014 17:45    Scheduled Meds: . acyclovir  400  mg Oral 5 X Daily  . calcium carbonate  1 tablet Oral BID WC  . heparin  5,000 Units Subcutaneous 3 times per day  . mirtazapine  15 mg Oral QHS  . piperacillin-tazobactam (ZOSYN)  IV  3.375 g Intravenous Q8H  . rOPINIRole  0.25 mg Oral Daily  . sodium chloride  3 mL Intravenous Q12H  . vancomycin  750 mg Intravenous Q12H  . vitamin B-12  100 mcg Oral Daily   Continuous Infusions:   Active Problems:   Cellulitis of buttock, right   Cellulitis of right  buttock    Time spent: 15 min    Bryan Omura  [facility] Pager [phone] If 7PM-7AM, please contact night-coverage at [email or web address], password [facility] 09/23/2014, 5:56 PM  LOS: 1 day

## 2014-09-23 NOTE — Progress Notes (Signed)
UR chart review completed.  

## 2014-09-23 NOTE — Care Management Note (Signed)
    Page 1 of 1   09/23/2014     2:19:29 PM CARE MANAGEMENT NOTE 09/23/2014  Patient:  Rebecca Cohen,Rebecca Cohen   Account Number:  [id number]  Date Initiated:  09/23/2014  Documentation initiated by:  Sharrie RothmanBLACKWELL,Rebecca Cohen  Subjective/Objective Assessment:   Pt admitted from home with cellulitis and boil. Pt lives with her husband and will return home at discharge. Pt is independent with ADL's.     Action/Plan:   Pt will need MATCH at discharge since pt has no insurance. Pt has PCP. Financial counselor is aware of self pay status.   Anticipated DC Date:  09/26/2014   Anticipated DC Plan:  HOME/SELF CARE  In-house referral  Financial Counselor      DC Planning Services  CM consult  MATCH Program      Choice offered to / List presented to:             Status of service:  Completed, signed off Medicare Important Message given?   (If response is "NO", the following Medicare IM given date fields will be blank) Date Medicare IM given:   Medicare IM given by:   Date Additional Medicare IM given:   Additional Medicare IM given by:    Discharge Disposition:  HOME/SELF CARE  Per UR Regulation:    If discussed at Long Length of Stay Meetings, dates discussed:    Comments:  09/23/14 1415 Rebecca Queenammy Cletis Muma, RN BSN CM

## 2014-09-24 LAB — HEMOGLOBIN A1C
HEMOGLOBIN A1C: 5.4 % (ref 4.8–5.6)
MEAN PLASMA GLUCOSE: 108 mg/dL

## 2014-09-24 LAB — RETICULOCYTES
RBC.: 2.99 MIL/uL — ABNORMAL LOW (ref 3.87–5.11)
RETIC CT PCT: 0.6 % (ref 0.4–3.1)
Retic Count, Absolute: 17.9 10*3/uL — ABNORMAL LOW (ref 19.0–186.0)

## 2014-09-24 LAB — CBC
HCT: 28.2 % — ABNORMAL LOW (ref 36.0–46.0)
Hemoglobin: 9.2 g/dL — ABNORMAL LOW (ref 12.0–15.0)
MCH: 30.8 pg (ref 26.0–34.0)
MCHC: 32.6 g/dL (ref 30.0–36.0)
MCV: 94.3 fL (ref 78.0–100.0)
PLATELETS: 233 10*3/uL (ref 150–400)
RBC: 2.99 MIL/uL — AB (ref 3.87–5.11)
RDW: 12.8 % (ref 11.5–15.5)
WBC: 8.8 10*3/uL (ref 4.0–10.5)

## 2014-09-24 MED ORDER — SODIUM CHLORIDE 0.9 % IV BOLUS (SEPSIS)
1000.0000 mL | Freq: Once | INTRAVENOUS | Status: AC
  Administered 2014-09-24: 1000 mL via INTRAVENOUS

## 2014-09-24 MED ORDER — SODIUM CHLORIDE 0.9 % IV SOLN
INTRAVENOUS | Status: DC
  Administered 2014-09-24 – 2014-09-25 (×2): via INTRAVENOUS

## 2014-09-24 NOTE — Progress Notes (Signed)
[facility] PROGRESS NOTE  Rebecca Cohen WUJ:[id number] DOB: 09-23-1955 DOA: 09/22/2014 PCP: Cassell Smiles., MD Interim summary: 59 year old lady admitted for right buttock cellulitis.  Assessment/Plan: 1. sepsis secondary to Cellulitis of the right buttock: no abscess seen on CT , on IV antibiotics. Improving. Slightly hypotensive today and bolus fluid given. Her bp improved with fluids and plan for discharge in am if stable.  2. Anemia: unclear etiology, anemia panel ordered and stool for occult blood ordered.  3. Leukocytosis: possibly from the cellulitis. Resolved.   Code Status: full code Family Communication: none at bedside Disposition Plan: home after PT EVAL.    Consultants:  none  Procedures:  CT pelvis  Antibiotics:  Vancomycin   zosyn  HPI/Subjective: Pain is better controlled.   Objective: Filed Vitals:   09/24/14 1509  BP: 96/61  Pulse: 85  Temp: 98.3 F (36.8 C)  Resp:     Intake/Output Summary (Last 24 hours) at 09/24/14 1704 Last data filed at 09/24/14 1326  Gross per 24 hour  Intake   1020 ml  Output      0 ml  Net   1020 ml   Filed Weights   09/22/14 1228 09/22/14 2228  Weight: 65.772 kg (145 lb) 61.326 kg (135 lb 3.2 oz)    Exam:   General:  Alert afebrile comfortable  Cardiovascular: s1s2  Respiratory: ctab  Abdomen: soft nontender non distended bowel sounds heard  Musculoskeletal: bandage over the right buttock.   Data Reviewed: Basic Metabolic Panel:  Recent Labs Lab 09/22/14 1349 09/23/14 0616  NA 138 137  K 3.9 3.7  CL 108 110  CO2 24 22  GLUCOSE 152* 129*  BUN 9 9  CREATININE 0.83 0.77  CALCIUM 9.5 8.4   Liver Function Tests:  Recent Labs Lab 09/22/14 1349 09/23/14 0616  AST 25 25  ALT 46* 33  ALKPHOS 141* 117  BILITOT 0.6 0.4  PROT 7.6 6.0  ALBUMIN 3.5 2.7*   No results for input(s): LIPASE, AMYLASE in the last 168 hours. No results for input(s): AMMONIA in the last 168  hours. CBC:  Recent Labs Lab 09/22/14 1349 09/23/14 0616 09/24/14 0619  WBC 23.3* 19.6* 8.8  NEUTROABS 18.9*  --   --   HGB 11.8* 9.7* 9.2*  HCT 35.9* 29.3* 28.2*  MCV 93.7 93.6 94.3  PLT 220 213 233   Cardiac Enzymes: No results for input(s): CKTOTAL, CKMB, CKMBINDEX, TROPONINI in the last 168 hours. BNP (last 3 results) No results for input(s): BNP in the last 8760 hours.  ProBNP (last 3 results) No results for input(s): PROBNP in the last 8760 hours.  CBG: No results for input(s): GLUCAP in the last 168 hours.  No results found for this or any previous visit (from the past 240 hour(s)).   Studies: No results found.  Scheduled Meds: . acyclovir  400 mg Oral 5 X Daily  . calcium carbonate  1 tablet Oral BID WC  . heparin  5,000 Units Subcutaneous 3 times per day  . mirtazapine  15 mg Oral QHS  . piperacillin-tazobactam (ZOSYN)  IV  3.375 g Intravenous Q8H  . rOPINIRole  0.25 mg Oral Daily  . sodium chloride  3 mL Intravenous Q12H  . vancomycin  750 mg Intravenous Q12H  . vitamin B-12  100 mcg Oral Daily   Continuous Infusions: . sodium chloride 75 mL/hr at 09/24/14 1141    Active Problems:   Cellulitis of buttock, right   Cellulitis of right buttock  Anemia due to other cause   Leukocytosis    Time spent: 15 min    Sully Dyment  [facility] Pager [phone] If 7PM-7AM, please contact night-coverage at [email or web address], password [email or web address] 09/24/2014, 5:04 PM  LOS: 2 days

## 2014-09-25 LAB — CBC
HCT: 29.1 % — ABNORMAL LOW (ref 36.0–46.0)
HEMOGLOBIN: 9.4 g/dL — AB (ref 12.0–15.0)
MCH: 30.3 pg (ref 26.0–34.0)
MCHC: 32.3 g/dL (ref 30.0–36.0)
MCV: 93.9 fL (ref 78.0–100.0)
PLATELETS: 274 10*3/uL (ref 150–400)
RBC: 3.1 MIL/uL — AB (ref 3.87–5.11)
RDW: 13 % (ref 11.5–15.5)
WBC: 7.5 10*3/uL (ref 4.0–10.5)

## 2014-09-25 LAB — FERRITIN: Ferritin: 466 ng/mL — ABNORMAL HIGH (ref 10–291)

## 2014-09-25 LAB — IRON AND TIBC
IRON: 62 ug/dL (ref 42–145)
SATURATION RATIOS: 38 % (ref 20–55)
TIBC: 164 ug/dL — AB (ref 250–470)
UIBC: 102 ug/dL — AB (ref 125–400)

## 2014-09-25 LAB — VITAMIN B12

## 2014-09-25 LAB — FOLATE: Folate: 11.6 ng/mL

## 2014-09-25 MED ORDER — CLINDAMYCIN HCL 300 MG PO CAPS: 300.0000 mg | ORAL_CAPSULE | Freq: Three times a day (TID) | ORAL | Status: DC

## 2014-09-25 NOTE — Discharge Summary (Signed)
Physician Discharge Summary  Rebecca ClimesRita C Hippler [id number] DOB: 01/30/1956 DOA: 09/22/2014  PCP: Cassell SmilesFUSCO,LAWRENCE J., MD  Admit date: 09/22/2014 Discharge date: 09/25/2014  Time spent: 30 minutes  Recommendations for Outpatient Follow-up:  1. Follow up w ith PCP in one week 2. Please follow up with PCP regarding anemia.   Discharge Diagnoses:  Active Problems:   Cellulitis of buttock, right   Cellulitis of right buttock   Anemia due to other cause   Leukocytosis   Discharge Condition: improved.   Diet recommendation: regular  Filed Weights   09/22/14 1228 09/22/14 2228  Weight: 65.772 kg (145 lb) 61.326 kg (135 lb 3.2 oz)    History of present illness:  59 year old lady admitted for right buttock cellulitis.   Hospital Course:  1. sepsis secondary to Cellulitis of the right buttock: no abscess seen on CT , on IV antibiotics. Improving. Plan to discharge pt on oral antibiotics to complete the course and follow up with PCP in less than a week.  2. Anemia: unclear etiology, anemia panel ordered and stool for occult blood ordered, but could not be obtained. Her H&h has been stable and we willd efer the further work up to outpatient.  3. Leukocytosis: possibly from the cellulitis. Resolved.  Procedures:  none  Consultations:  none  Discharge Exam: Filed Vitals:   09/25/14 1344  BP: 98/64  Pulse: 80  Temp: 99.1 F (37.3 C)  Resp: 18    General: alert afebrile comfortable Cardiovascular: 1s2 Respiratory: ctab  Discharge Instructions   Discharge Instructions    Call MD for:  persistant dizziness or light-headedness    Complete by:  As directed      Call MD for:  redness, tenderness, or signs of infection (pain, swelling, redness, odor or green/yellow discharge around incision site)    Complete by:  As directed      Call MD for:  severe uncontrolled pain    Complete by:  As directed      Call MD for:  temperature >100.4    Complete by:  As directed      Diet  - low sodium heart healthy    Complete by:  As directed      Discharge instructions    Complete by:  As directed   FOLLOW UP with PCP in one week. Change dressing daily.          Current Discharge Medication List    START taking these medications   Details  clindamycin (CLEOCIN) 300 MG capsule Take 1 capsule (300 mg total) by mouth 3 (three) times daily. Qty: 18 capsule, Refills: 0      CONTINUE these medications which have NOT CHANGED   Details  acyclovir (ZOVIRAX) 400 MG tablet Take 400 mg by mouth 5 (five) times daily.     albuterol (PROVENTIL) 2 MG tablet Take 2-4 mg by mouth 3 (three) times daily as needed for wheezing or shortness of breath.     calcium carbonate (OS-CAL) 600 MG TABS Take 600 mg by mouth 2 (two) times daily with a meal.      HYDROcodone-acetaminophen (NORCO) 7.5-325 MG per tablet Take 1 tablet by mouth 4 (four) times daily as needed (pain).  Refills: 0    LORazepam (ATIVAN) 2 MG tablet Take 2 mg by mouth 4 (four) times daily as needed for anxiety.     mirtazapine (REMERON) 15 MG tablet Take 15 mg by mouth at bedtime. Refills: 11    rOPINIRole (REQUIP) 0.25 MG tablet Take  0.25 mg by mouth daily.    diphenhydrAMINE (SOMINEX) 25 MG tablet Take 25 mg by mouth at bedtime as needed for sleep.       STOP taking these medications     Cyanocobalamin (B-12) 100 MCG TABS        Allergies  Allergen Reactions  . Bextra [Valdecoxib] Anaphylaxis  . Sulfa Drugs Cross Reactors Anaphylaxis and Rash   Follow-up Information    Follow up with Cassell Smiles., MD. Schedule an appointment as soon as possible for a visit in 1 week.   Specialty:  Internal Medicine   Contact information:   [address] [place] [address] [phone]        The results of significant diagnostics from this hospitalization (including imaging, microbiology, ancillary and laboratory) are listed below for reference.    Significant Diagnostic Studies: Ct Pelvis W  Contrast  09/22/2014   CLINICAL DATA:  Right gluteal abscess.  EXAM: CT PELVIS WITH CONTRAST  TECHNIQUE: Multidetector CT imaging of the pelvis was performed using the standard protocol following the bolus administration of intravenous contrast.  CONTRAST:  OMNIPAQUE IOHEXOL 300 MG/ML  SOLN  COMPARISON:  12/19/2004 abdominal CT  FINDINGS: There is gas and subcutaneous fat edema in the medial right gluteal region, likely an opened abscess. No spreading subcutaneous gas typical of a necrotizing infection. No opaque foreign body. There is no rim enhancing fluid collection to suggest mature abscess. No visible fistula to the anus, although MRI is better for this purpose if there is clinical suspicion for fistula. No supralevator involvement.  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 Limited lower abdominal imaging is negative for acute abnormality.  IMPRESSION: Right gluteal cellulitis with small volume gas. No mature abscess or supralevator inflammation.   Electronically Signed   By: Tiburcio Pea M.D.   On: 09/22/2014 17:45    Microbiology: No results found for this or any previous visit (from the past 240 hour(s)).   Labs: Basic Metabolic Panel:  Recent Labs Lab 09/22/14 1349 09/23/14 0616  NA 138 137  K 3.9 3.7  CL 108 110  CO2 24 22  GLUCOSE 152* 129*  BUN 9 9  CREATININE 0.83 0.77  CALCIUM 9.5 8.4   Liver Function Tests:  Recent Labs Lab 09/22/14 1349 09/23/14 0616  AST 25 25  ALT 46* 33  ALKPHOS 141* 117  BILITOT 0.6 0.4  PROT 7.6 6.0  ALBUMIN 3.5 2.7*   No results for input(s): LIPASE, AMYLASE in the last 168 hours. No results for input(s): AMMONIA in the last 168 hours. CBC:  Recent Labs Lab 09/22/14 1349 09/23/14 0616 09/24/14 0619 09/25/14 0611  WBC 23.3* 19.6* 8.8 7.5  NEUTROABS 18.9*  --   --   --   HGB 11.8* 9.7* 9.2* 9.4*  HCT 35.9* 29.3* 28.2* 29.1*  MCV 93.7 93.6 94.3 93.9  PLT 220 213 233 274   Cardiac Enzymes: No  results for input(s): CKTOTAL, CKMB, CKMBINDEX, TROPONINI in the last 168 hours. BNP: BNP (last 3 results) No results for input(s): BNP in the last 8760 hours.  ProBNP (last 3 results) No results for input(s): PROBNP in the last 8760 hours.  CBG: No results for input(s): GLUCAP in the last 168 hours.     SignedKathlen Mody  [facility] 09/25/2014, 1:45 PM

## 2014-12-01 ENCOUNTER — Ambulatory Visit: Payer: Self-pay | Admitting: Orthopedic Surgery

## 2014-12-27 ENCOUNTER — Encounter: Payer: Self-pay | Admitting: Orthopedic Surgery

## 2014-12-27 ENCOUNTER — Ambulatory Visit: Payer: Self-pay | Admitting: Orthopedic Surgery

## 2015-01-06 ENCOUNTER — Emergency Department (HOSPITAL_COMMUNITY): Payer: Self-pay

## 2015-01-06 ENCOUNTER — Encounter (HOSPITAL_COMMUNITY): Payer: Self-pay | Admitting: Emergency Medicine

## 2015-01-06 ENCOUNTER — Emergency Department (HOSPITAL_COMMUNITY)
Admission: EM | Admit: 2015-01-06 | Discharge: 2015-01-06 | Disposition: A | Discharge status: Home / Self Care | Payer: Self-pay | Attending: Emergency Medicine | Admitting: Emergency Medicine

## 2015-01-06 DIAGNOSIS — F329 Major depressive disorder, single episode, unspecified: Secondary | ICD-10-CM | POA: Insufficient documentation

## 2015-01-06 DIAGNOSIS — B349 Viral infection, unspecified: Secondary | ICD-10-CM | POA: Insufficient documentation

## 2015-01-06 DIAGNOSIS — J449 Chronic obstructive pulmonary disease, unspecified: Secondary | ICD-10-CM | POA: Insufficient documentation

## 2015-01-06 DIAGNOSIS — Z79899 Other long term (current) drug therapy: Secondary | ICD-10-CM | POA: Insufficient documentation

## 2015-01-06 DIAGNOSIS — Z792 Long term (current) use of antibiotics: Secondary | ICD-10-CM | POA: Insufficient documentation

## 2015-01-06 DIAGNOSIS — M791 Myalgia: Secondary | ICD-10-CM | POA: Insufficient documentation

## 2015-01-06 DIAGNOSIS — Z8639 Personal history of other endocrine, nutritional and metabolic disease: Secondary | ICD-10-CM | POA: Insufficient documentation

## 2015-01-06 DIAGNOSIS — Z8719 Personal history of other diseases of the digestive system: Secondary | ICD-10-CM | POA: Insufficient documentation

## 2015-01-06 DIAGNOSIS — R630 Anorexia: Secondary | ICD-10-CM | POA: Insufficient documentation

## 2015-01-06 DIAGNOSIS — Z720 Tobacco use: Secondary | ICD-10-CM | POA: Insufficient documentation

## 2015-01-06 LAB — URINALYSIS, ROUTINE W REFLEX MICROSCOPIC
BILIRUBIN URINE: NEGATIVE
Glucose, UA: NEGATIVE mg/dL
Hgb urine dipstick: NEGATIVE
KETONES UR: NEGATIVE mg/dL
Leukocytes, UA: NEGATIVE
Nitrite: NEGATIVE
PROTEIN: NEGATIVE mg/dL
SPECIFIC GRAVITY, URINE: 1.02 (ref 1.005–1.030)
UROBILINOGEN UA: 0.2 mg/dL (ref 0.0–1.0)
pH: 6 (ref 5.0–8.0)

## 2015-01-06 LAB — RAPID STREP SCREEN (MED CTR MEBANE ONLY): Streptococcus, Group A Screen (Direct): NEGATIVE

## 2015-01-06 MED ORDER — ACETAMINOPHEN 500 MG PO TABS
1000.0000 mg | ORAL_TABLET | Freq: Once | ORAL | Status: AC
  Administered 2015-01-06: 1000 mg via ORAL
  Filled 2015-01-06: qty 2

## 2015-01-06 NOTE — ED Notes (Addendum)
Pt reports intermittent fever,body aches, sore throat, "swollen lymph nodes",headache x4 days.  Airway patent. nad noted. Pt denies n/v.

## 2015-01-06 NOTE — Discharge Instructions (Signed)
Your x-ray is negative for acute changes. Your strep test is negative. Your urine test is also negative. Suspect that you have a viral illness. Please increase fluids. Please wash hands frequently. Please use Tylenol every 4 hours for fever or aching. Please use your favorite decongestant for your nasal congestion. Please see Dr.Fusco in the office for reevaluation of the lymph nodes in your neck, and question of thyroid involvement. Viral Infections A virus is a type of germ. Viruses can cause:  Minor sore throats.  Aches and pains.  Headaches.  Runny nose.  Rashes.  Watery eyes.  Tiredness.  Coughs.  Loss of appetite.  Feeling sick to your stomach (nausea).  Throwing up (vomiting).  Watery poop (diarrhea). HOME CARE   Only take medicines as told by your doctor.  Drink enough water and fluids to keep your pee (urine) clear or pale yellow. Sports drinks are a good choice.  Get plenty of rest and eat healthy. Soups and broths with crackers or rice are fine. GET HELP RIGHT AWAY IF:   You have a very bad headache.  You have shortness of breath.  You have chest pain or neck pain.  You have an unusual rash.  You cannot stop throwing up.  You have watery poop that does not stop.  You cannot keep fluids down.  You or your child has a temperature by mouth above 102 F (38.9 C), not controlled by medicine.  Your baby is older than 3 months with a rectal temperature of 102 F (38.9 C) or higher.  Your baby is 763 months old or younger with a rectal temperature of 100.4 F (38 C) or higher. MAKE SURE YOU:   Understand these instructions.  Will watch this condition.  Will get help right away if you are not doing well or get worse. Document Released: 07/18/2008 Document Revised: 10/28/2011 Document Reviewed: 12/11/2010 [facility] Patient Information 2015 [place] [place]. This information is not intended to replace advice given to you by your health care provider. Make  sure you discuss any questions you have with your health care provider.

## 2015-01-06 NOTE — ED Provider Notes (Signed)
Medical screening examination/treatment/procedure(s) were conducted as a shared visit with non-physician practitioner(s) and myself.  I personally evaluated the patient during the encounter.  Pt's neck with lymphadenopathy anterior cervical chain.  No erythema or fluctuance.  No definite goiter. Labx, us , strep, cxr ordered  Linwood DibblesJon Marquisha Nikolov, MD 01/06/15 [phone]

## 2015-01-06 NOTE — ED Provider Notes (Signed)
CSN: [id number]     Arrival date & time 01/06/15  1830 History   None    Chief Complaint  Patient presents with  . Generalized Body Aches     (Consider location/radiation/quality/duration/timing/severity/associated sxs/prior Treatment) HPI Comments: Patient is a 59 year old female who presents to the emergency department with a complaint of generalized body aches sore throat, headache, and swollen lymph nodes.  Patient states that about 4 days ago she began having problems with headache followed by a fever, body aches, and most recently she has noted swollen lymph nodes in the neck area. The patient states she generally does not feel well. She has not had any reported dysuria, and no hemoptysis. She reports that she did not measure a temperature, but just felt warm, and this was accompanied by body aching. No vomiting or diarrhea reported. No unusual rash noted. No tick bites reported.  The history is provided by the patient.    Past Medical History  Diagnosis Date  . Depression   . Osteopenia     previously on Fosamax  . COPD (chronic obstructive pulmonary disease)   . Hyperlipidemia   . GERD (gastroesophageal reflux disease)     pt sts sfter egd with ed and diet change is on no meds and has no more problems with this   Past Surgical History  Procedure Laterality Date  . S/p hysterectomy    . Cervical disc surgery    . 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  . Abdominal hysterectomy      1990's  . Colonoscopy  03/14/2011    Procedure: COLONOSCOPY;  Surgeon: Corbin Adeobert M Rourk, MD;  Location: [facility];  Service: Gastroenterology;  Laterality: N/A;  In the cecum at 1332.  Total withdrawal time 8    minutes.   Family History  Problem Relation Age of Onset  . Colon cancer Brother 50    deceased  . Anesthesia problems Neg Hx   . Hypotension Neg Hx   . Malignant hyperthermia Neg Hx   . Pseudochol deficiency Neg Hx    History   Substance Use Topics  . Smoking status: Current Every Day Smoker -- 0.25 packs/day for 20 years    Types: Cigarettes  . Smokeless tobacco: Not on file     Comment: 1/2 pack a day  . Alcohol Use: No   OB History    No data available     Review of Systems  Constitutional: Positive for fever, chills, activity change and appetite change.  HENT: Positive for congestion.   Gastrointestinal: Negative for nausea, vomiting and diarrhea.  Musculoskeletal: Positive for myalgias.  All other systems reviewed and are negative.     Allergies  Bextra and Sulfa drugs cross reactors  Home Medications   Prior to Admission medications   Medication Sig Start Date End Date Taking? Authorizing Provider  acyclovir (ZOVIRAX) 400 MG tablet Take 400 mg by mouth 5 (five) times daily.     Historical Provider, MD  albuterol (PROVENTIL) 2 MG tablet Take 2-4 mg by mouth 3 (three) times daily as needed for wheezing or shortness of breath.     Historical Provider, MD  calcium carbonate (OS-CAL) 600 MG TABS Take 600 mg by mouth 2 (two) times daily with a meal.      Historical Provider, MD  clindamycin (CLEOCIN) 300 MG capsule Take 1 capsule (300 mg total) by mouth 3 (three) times daily. 09/25/14   Kathlen ModyVijaya Akula, MD  diphenhydrAMINE (  SOMINEX) 25 MG tablet Take 25 mg by mouth at bedtime as needed for sleep.     Historical Provider, MD  HYDROcodone-acetaminophen (NORCO) 7.5-325 MG per tablet Take 1 tablet by mouth 4 (four) times daily as needed (pain).  09/04/14   Historical Provider, MD  LORazepam (ATIVAN) 2 MG tablet Take 2 mg by mouth 4 (four) times daily as needed for anxiety.     Historical Provider, MD  mirtazapine (REMERON) 15 MG tablet Take 15 mg by mouth at bedtime. 09/11/14   Historical Provider, MD  rOPINIRole (REQUIP) 0.25 MG tablet Take 0.25 mg by mouth daily.    Historical Provider, MD   BP 120/81 mmHg  Pulse 78  Temp(Src) 97.8 F (36.6 C) (Oral)  Resp 16  Ht 5\' 6"  (1.676 m)  Wt 140 lb (63.504 kg)   BMI 22.61 kg/m2  SpO2 99% Physical Exam  Constitutional: She is oriented to person, place, and time. She appears well-developed and well-nourished.  Non-toxic appearance.  HENT:  Head: Normocephalic.  Right Ear: Tympanic membrane and external ear normal.  Left Ear: Tympanic membrane and external ear normal.  Mild to moderate nasal congestion present.  The uvula is enlarged, but midline. There is increased redness of the posterior pharynx, no exudate appreciated. Airway is patent.  Eyes: EOM and lids are normal. Pupils are equal, round, and reactive to light.  Neck: Normal range of motion. Neck supple. Carotid bruit is not present.  There are swollen lymph nodes in the cervical chain. There is also question of enlargement about the thyroid area.  Cardiovascular: Normal rate, regular rhythm, normal heart sounds, intact distal pulses and normal pulses.   Pulmonary/Chest: Breath sounds normal. No respiratory distress.  Abdominal: Soft. Bowel sounds are normal. There is no tenderness. There is no guarding.  Musculoskeletal: Normal range of motion.  Lymphadenopathy:       Head (right side): No submandibular adenopathy present.       Head (left side): No submandibular adenopathy present.    She has cervical adenopathy.  Neurological: She is alert and oriented to person, place, and time. She has normal strength. No cranial nerve deficit or sensory deficit.  Skin: Skin is warm and dry. No rash noted.  Psychiatric: She has a normal mood and affect. Her speech is normal.  Nursing note and vitals reviewed.   ED Course  Pt seen with me by Dr Lynelle DoctorKnapp.  Procedures (including critical care time) Labs Review Labs Reviewed  RAPID STREP SCREEN  URINALYSIS, ROUTINE W REFLEX MICROSCOPIC    Imaging Review No results found.   EKG Interpretation None      MDM  Vital signs are well within normal limits. Pulse oximetry is 99% on room air. Within normal limits by my interpretation.  Rapid strep  test is negative. Urine analysis is negative. Chest x-ray shows no acute cardiopulmonary process present. Suspect the patient has a viral upper respiratory infection . Patient advised to increase fluids, wash hands frequently, and use Tylenol every 4 hours for fever or aching. I have asked patient to see Dr. Sherwood GamblerFusco for recheck of her swollen lymph nodes and questionable thyroid involvement as an outpatient in the office.    Final diagnoses:  None    **I have reviewed nursing notes, vital signs, and all appropriate lab and imaging results for this patient.Ivery Quale*    Mose Colaizzi, PA-C 01/06/15 2003  Linwood DibblesJon Knapp, MD 01/06/15 [phone]

## 2015-01-09 LAB — CULTURE, GROUP A STREP: STREP A CULTURE: NEGATIVE

## 2015-09-29 ENCOUNTER — Emergency Department (HOSPITAL_COMMUNITY): Payer: BLUE CROSS/BLUE SHIELD

## 2015-09-29 ENCOUNTER — Encounter (HOSPITAL_COMMUNITY): Payer: Self-pay | Admitting: Emergency Medicine

## 2015-09-29 ENCOUNTER — Emergency Department (HOSPITAL_COMMUNITY)
Admission: EM | Admit: 2015-09-29 | Discharge: 2015-09-29 | Disposition: A | Discharge status: Home / Self Care | Payer: BLUE CROSS/BLUE SHIELD | Attending: Emergency Medicine | Admitting: Emergency Medicine

## 2015-09-29 DIAGNOSIS — Z8639 Personal history of other endocrine, nutritional and metabolic disease: Secondary | ICD-10-CM | POA: Diagnosis not present

## 2015-09-29 DIAGNOSIS — F17210 Nicotine dependence, cigarettes, uncomplicated: Secondary | ICD-10-CM | POA: Insufficient documentation

## 2015-09-29 DIAGNOSIS — Z79899 Other long term (current) drug therapy: Secondary | ICD-10-CM | POA: Insufficient documentation

## 2015-09-29 DIAGNOSIS — J449 Chronic obstructive pulmonary disease, unspecified: Secondary | ICD-10-CM | POA: Insufficient documentation

## 2015-09-29 DIAGNOSIS — R079 Chest pain, unspecified: Secondary | ICD-10-CM | POA: Diagnosis present

## 2015-09-29 DIAGNOSIS — R0789 Other chest pain: Secondary | ICD-10-CM | POA: Insufficient documentation

## 2015-09-29 DIAGNOSIS — M8580 Other specified disorders of bone density and structure, unspecified site: Secondary | ICD-10-CM | POA: Insufficient documentation

## 2015-09-29 DIAGNOSIS — F329 Major depressive disorder, single episode, unspecified: Secondary | ICD-10-CM | POA: Insufficient documentation

## 2015-09-29 DIAGNOSIS — R52 Pain, unspecified: Secondary | ICD-10-CM

## 2015-09-29 LAB — CBC
HEMATOCRIT: 39.8 % (ref 36.0–46.0)
Hemoglobin: 13.1 g/dL (ref 12.0–15.0)
MCH: 31 pg (ref 26.0–34.0)
MCHC: 32.9 g/dL (ref 30.0–36.0)
MCV: 94.3 fL (ref 78.0–100.0)
Platelets: 195 10*3/uL (ref 150–400)
RBC: 4.22 MIL/uL (ref 3.87–5.11)
RDW: 13 % (ref 11.5–15.5)
WBC: 6.8 10*3/uL (ref 4.0–10.5)

## 2015-09-29 LAB — BASIC METABOLIC PANEL
Anion gap: 8 (ref 5–15)
BUN: 8 mg/dL (ref 6–20)
CALCIUM: 9.4 mg/dL (ref 8.9–10.3)
CO2: 26 mmol/L (ref 22–32)
CREATININE: 0.68 mg/dL (ref 0.44–1.00)
Chloride: 105 mmol/L (ref 101–111)
GFR calc Af Amer: >60 mL/min (ref >60)
GLUCOSE: 97 mg/dL (ref 65–99)
Potassium: 3.9 mmol/L (ref 3.5–5.1)
Sodium: 139 mmol/L (ref 135–145)

## 2015-09-29 LAB — TROPONIN I

## 2015-09-29 LAB — D-DIMER, QUANTITATIVE (NOT AT ARMC): D DIMER QUANT: 1.07 ug{FEU}/mL — AB (ref 0.00–0.50)

## 2015-09-29 MED ORDER — HYDROCODONE-ACETAMINOPHEN 5-325 MG PO TABS
1.0000 | ORAL_TABLET | Freq: Once | ORAL | Status: AC
  Administered 2015-09-29: 1 via ORAL
  Filled 2015-09-29: qty 1

## 2015-09-29 MED ORDER — HYDROCODONE-ACETAMINOPHEN 5-325 MG PO TABS: ORAL_TABLET | ORAL | Status: DC

## 2015-09-29 MED ORDER — IOHEXOL 350 MG/ML SOLN
100.0000 mL | Freq: Once | INTRAVENOUS | Status: AC | PRN
  Administered 2015-09-29: 100 mL via INTRAVENOUS

## 2015-09-29 MED ORDER — METHOCARBAMOL 500 MG PO TABS: 1000.0000 mg | ORAL_TABLET | Freq: Four times a day (QID) | ORAL | Status: DC | PRN

## 2015-09-29 NOTE — Discharge Instructions (Signed)
°Emergency Department Resource Guide °1) Find a Doctor and Pay Out of Pocket °Although you won't have to find out who is covered by your insurance plan, it is a good idea to ask around and get recommendations. You will then need to call the office and see if the doctor you have chosen will accept you as a new patient and what types of options they offer for patients who are self-pay. Some doctors offer discounts or will set up payment plans for their patients who do not have insurance, but you will need to ask so you aren't surprised when you get to your appointment. ° °2) Contact Your Local Health Department °Not all health departments have doctors that can see patients for sick visits, but many do, so it is worth a call to see if yours does. If you don't know where your local health department is, you can check in your phone book. The CDC also has a tool to help you locate your state's health department, and many state websites also have listings of all of their local health departments. ° °3) Find a Walk-in Clinic °If your illness is not likely to be very severe or complicated, you may want to try a walk in clinic. These are popping up all over the country in pharmacies, drugstores, and shopping centers. They're usually staffed by nurse practitioners or physician assistants that have been trained to treat common illnesses and complaints. They're usually fairly quick and inexpensive. However, if you have serious medical issues or chronic medical problems, these are probably not your best option. ° °No Primary Care Doctor: °- Call Health Connect at  832-8000 - they can help you locate a primary care doctor that  accepts your insurance, provides certain services, etc. °- Physician Referral Service- 1-800-533-3463 ° °Chronic Pain Problems: °Organization         Address  Phone   Notes  °[facility]  (336) 297-2271 Patients need to be referred by their primary care doctor.  ° °Medication  Assistance: °Organization         Address  Phone   Notes  °[place] [facility] [address] °[place], [place] [address] (336) 641-8030 --Must be a resident of [place] °-- Must have NO insurance coverage whatsoever (no Medicaid/ Medicare, etc.) °-- The pt. MUST have a primary care doctor that directs their care regularly and follows them in the community °  °[facility]  (866) 331-1348   °[employer]  (888) 892-1162   ° °Agencies that provide inexpensive medical care: °Organization         Address  Phone   Notes  °[facility]  (336) 832-8035   °[facility]    (336) 832-7272   °[facility] [address] °[place], [place] [address] (336) 832-4777   °[facility] [address], °[place] (336) 271-4999   °[facility]    (336) 373-0678   °[facility]    (336) 272-1050   °[facility] ° [address], [place] Phone:  (336) 832-4444, Fax:  (336) 832-4440 Hours of Operation:  9 am - 6 pm, M-F.  Also accepts Medicaid/Medicare and self-pay.  °[facility] ° [address], [place] Phone: (336) 832-3150, Fax: (336) [phone]. Hours of Operation:  8:30 am - 5:30 pm, M-F.  Also accepts Medicaid and self-pay.  °[facility] [place] [address], [place] Phone: (336) 878-6027   °[facility] [address], [place], [place] (336)723-1848, Ext. 123 Mondays & Thursdays: 7-9 AM.  First 15 patients are seen on a first come, first serve basis. °  ° °Medicaid-accepting [place] Providers: ° °Organization         Address  Phone   Notes  °[facility] [address], [place] (336) 641-2100 Also accepts self-pay patients.  °[facility] [address], [place] ° (336) 856-9996   °[facility] [address], [place]  (336) 288-8857   °[facility] [address] [place] [address], [place] (336) 299-7000   °Veita Bland [address], [place]  ° (336) 373-1557 Only accepts [place] Access Medicaid patients after they have their name applied to their card.  ° °Self-Pay (no insurance) in [place]: ° °Organization         Address  Phone   Notes  °Sickle Cell Patients, [facility] [address], [place] (336) 832-1970   °[facility] [address], [place] (336) 832-4400   °[facility] ° [address], [place] (336) 992-4800   °[facility]/Dr. Osei-Bonsu ° [address] [place] [address], [place] or [address], [place] (336) [phone] Phone number for both [place] and [place] locations is the same.  °[facility] [address], [place] (336) 299-0000   °[facility] [address] [place] [address], [place] or [address] (336) 852-7530 °(336) 878-2260   °[facility] [address], [place] (336) 350-1642, phone; (336) 294-5005, fax Sees patients 1st and 3rd Saturday of every month.  Must not qualify for public or private insurance (i.e. Medicaid, Medicare, [place] Health Choice, Veterans' Benefits) • Household income should be no more than 200% of the poverty level •The clinic cannot treat you if you are pregnant or think you are pregnant • Sexually transmitted diseases are not treated at the clinic.  ° ° °Dental Care: °Organization         Address  Phone  Notes  °[place] [facility] [address], [place] (336) 641-6152 Accepts children up to age 21 who are enrolled in Medicaid or [place] Health Choice; pregnant women with a Medicaid card; and children who have applied for Medicaid or [place] Health Choice, but were declined, whose parents can pay a reduced fee at time of service.  °[place]  [facility] [place]  [address], [place] (336) 641-7733 Accepts children up to age 21 who are enrolled in Medicaid or [place] Health Choice; pregnant women with a Medicaid card; and children who have applied for Medicaid or [place] Health Choice, but were declined, whose parents can pay a reduced fee at time of service.  °[facility] ° [address], [place] (336) 641-4533 Patients are seen by appointment only. Walk-ins are not accepted. [facility] will see patients 18 years of age and older. °Monday - Tuesday (8am-5pm) °Most Wednesdays (8:30-5pm) °$30 per visit, cash only  °[facility] ° [address], [place] (336) 641-4533 Patients are seen by appointment only. Walk-ins are not accepted. [facility] will see patients 18 years of age and older. °One   Wednesday Evening (Monthly: Volunteer Based).  $30 per visit, cash only  °[facility]  (919) [phone] for adults; Children under age 4, call Graduate Pediatric Dentistry at ([phone]) [phone]. Children aged 4-14, please call ([phone]) [phone] to request a pediatric application. ° Dental services are provided in all areas of dental care including fillings, crowns and bridges, complete and partial dentures, implants, gum treatment, root canals, and extractions. Preventive care is also provided. Treatment is provided to both adults and children. °Patients are selected via a lottery and there is often a waiting list. °  °[facility] [address], °[place] ° (336) 763-8833 [email or web address] °  °[facility] [address], [place], [place] (336)723-1848, Ext. 123 Second and Fourth Thursday of each month, opens at 6:30 AM; Clinic ends at 9 AM.  Patients are seen on a first-come first-served basis, and a limited number are seen during each clinic.  ° °[facility] ° [address], [place], [place] (336) 723-7904    Eligibility Requirements °You must have lived in [place], [place], or [place] counties for at least the last three months. °  You cannot be eligible for state or federal sponsored healthcare insurance, including Veterans Administration, Medicaid, or Medicare. °  You generally cannot be eligible for healthcare insurance through your employer.  °  How to apply: °Eligibility screenings are held every Tuesday and Wednesday afternoon from 1:00 pm until 4:00 pm. You do not need an appointment for the interview!  °[facility] [address], [place], [place] 336-631-2330   °[facility]  336-342-8273   °[facility]  336-703-3100   °[facility]  336-570-6415   ° °Behavioral Health Resources in the Community: °Intensive Outpatient Programs °Organization         Address  Phone  Notes  °[facility] [address], [place], [place] 336-878-6098   °[facility] [address], [place], [place] 336-832-9800   °[facility] [address], [place], [place] ° 336-882-2125   °[facility] [address],  °[place], [place] 1-800-853-5163 or 336-641-4981   °Substance Abuse Resources °Organization         Address  Phone  Notes  °[facility]  336-882-2125   °[facility]  336-784-9470   °[facility]  336-285-9073   °[facility]  336-845-3988   °Residential & Outpatient Substance Abuse Program  1-800-659-3381   °Psychological Services °Organization         Address  Phone  Notes  °[facility]  336- 832-9600   °[facility]  336- 378-7881   °[facility] [address], [place] 1-800-853-5163 or 336-641-4981   ° °Mobile Crisis Teams °Organization         Address  Phone  Notes  °[facility], Mobile Crisis Care Unit  1-877-626-1772   °Assertive °[facility] ° [address]  [place], [place] 336-834-9664   °Sharon DeEsch [address] °[place] 336-554-5454   ° °Self-Help/Support Groups °Organization         Address  Phone             Notes  °[facility]  - variety of support groups  336- 373-1402 Call for more information  °Narcotics Anonymous (NA), [facility] [address], °[place]  2 meetings at this location  ° °  Residential Treatment Programs Organization         Address  Phone  Notes  [facility] [address]    [place]  8-295-621-3086   [facility]  [address] [place] [address], [place], [place] 629-528-4132   [facility] [address] [place], [place] 440-102-7253 Admissions: 8am-3pm M-F  [facility] [address]    [place], [place] 664-403-4742   [facility] [address] [place], [place], [place] 595-638-7564   [facility] [address],  [place], [place] 332-951-8841   [facility] - Intensive Outpatient [address], [place], [place] 660-630-1601   [facility] ([facility] [address]  [place], [place] 0-932-355-7322 or (239)473-9716   [facility] ([facility] [address], [place], [place] 762-831-5176 Accepts Medicaid  [facility] [address]  [place] 1-607-371-0626 Substance Abuse/Addiction Treatment   [facility]         Address  Phone  Notes  [facility]  989-603-2667   Angie Fava, PhD [address] [place], [place]   (660)319-6397 or (979) 617-1428   [facility]   [address] [place], [place] (301)319-3985   [facility] [address], [place], [place] [phone] Insurance/Medicaid/sponsorship through [facility] and Families [address]                                    [place], [place] (726)[phone] Therapy/tele-psych/case    [facility] [address], [place] (567)713-2865    Dr. Lolly Mustache  (304)049-9939   [facility]  [employer] [facility] Dept. 1) [address], [place] 2) [address] [place] 3)  [address], [place] ([phone])[phone] ([phone])[phone]  7031383460   [facility] Child Abuse Hotline 864-844-5880 or (206)480-5281 (After Hours)      Take the prescriptions as directed.  Apply moist heat or ice to the area(s) of discomfort, for 15 minutes at a time, several times per day for the next few days.  Do not fall asleep on a heating or ice pack. Your CT scan showed an incidental finding: "Few scattered subcentimeter pulmonary nodules measuring up to 5 mm as above. Indeterminate. If the patient is at high risk for bronchogenic carcinoma, follow-up chest CT at 6-12 months is recommended. If the patient is at low risk for bronchogenic carcinoma, follow-up chest CT at 12 months is recommended."  Call your regular medical doctor on Monday to schedule a follow up appointment in the next 3 days and to further investigate this finding. Call the Cardiologist on Monday to schedule a follow up appointment within the next week regarding the changes in your EKG from 5 years ago.  Return to the Emergency Department immediately if worsening.

## 2015-09-29 NOTE — ED Provider Notes (Signed)
CSN: [id number]     Arrival date & time 09/29/15  1601 History   First MD Initiated Contact with Patient 09/29/15 1657     Chief Complaint  Patient presents with  . Chest Pain      HPI Pt was seen at 1700. Per pt, c/o gradual onset and persistence of constant left sided chest wall "pain" for the past 1.5 weeks. Pain worsens with palpation of the area, deep breath and body position changes. Pt states she "had a cough and several episodes of vomiting" before her symptoms began 1.5 weeks ago. Pt describes the pain as "sharp," and "like I broke a rib." Pt states the pain "will ease some" when she splints her left chest wall with a firm object, but the pain continues to be present. Denies direct injury, no fevers, no back pain, no palpitations, no SOB, no further cough, no abd pain, no further N/V, no diarrhea, no rash.    Past Medical History  Diagnosis Date  . Depression   . Osteopenia     previously on Fosamax  . COPD (chronic obstructive pulmonary disease) (HCC)   . Hyperlipidemia   . GERD (gastroesophageal reflux disease)     pt sts sfter egd with ed and diet change is on no meds and has no more problems with this   Past Surgical History  Procedure Laterality Date  . S/p hysterectomy    . Cervical disc surgery    . 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  . Abdominal hysterectomy      1990's  . Colonoscopy  03/14/2011    Procedure: COLONOSCOPY;  Surgeon: Corbin Ade, MD;  Location: [facility];  Service: Gastroenterology;  Laterality: N/A;  In the cecum at 1332.  Total withdrawal time 8    minutes.   Family History  Problem Relation Age of Onset  . Colon cancer Brother 50    deceased  . Anesthesia problems Neg Hx   . Hypotension Neg Hx   . Malignant hyperthermia Neg Hx   . Pseudochol deficiency Neg Hx    Social History  Substance Use Topics  . Smoking status: Current Every Day Smoker -- 0.25 packs/day for 20 years     Types: Cigarettes  . Smokeless tobacco: None     Comment: 1/2 pack a day  . Alcohol Use: No    Review of Systems ROS: Statement: All systems negative except as marked or noted in the HPI; Constitutional: Negative for fever and chills. ; ; Eyes: Negative for eye pain, redness and discharge. ; ; ENMT: Negative for ear pain, hoarseness, nasal congestion, sinus pressure and sore throat. ; ; Cardiovascular: Negative for palpitations, diaphoresis, dyspnea and peripheral edema. ; ; Respiratory: Negative for cough, wheezing and stridor. ; ; Gastrointestinal: Negative for nausea, vomiting, diarrhea, abdominal pain, blood in stool, hematemesis, jaundice and rectal bleeding. . ; ; Genitourinary: Negative for dysuria, flank pain and hematuria. ; ; Musculoskeletal: +chest wall pain. Negative for back pain and neck pain. Negative for swelling and trauma.; ; Skin: Negative for pruritus, rash, abrasions, blisters, bruising and skin lesion.; ; Neuro: Negative for headache, lightheadedness and neck stiffness. Negative for weakness, altered level of consciousness , altered mental status, extremity weakness, paresthesias, involuntary movement, seizure and syncope.      Allergies  Bextra and Sulfa drugs cross reactors  Home Medications   Prior to Admission medications   Medication Sig Start Date End Date Taking? Authorizing Provider  acyclovir (ZOVIRAX) 400 MG tablet Take 400 mg by mouth 5 (five) times daily. When flare up occurs   Yes Historical Provider, MD  calcium carbonate (OS-CAL) 600 MG TABS Take 600 mg by mouth 2 (two) times daily with a meal.     Yes Historical Provider, MD  diphenhydrAMINE (BENADRYL) 25 MG tablet Take 25 mg by mouth daily as needed for allergies.   Yes Historical Provider, MD  HYDROcodone-acetaminophen (NORCO) 7.5-325 MG per tablet Take 1 tablet by mouth 4 (four) times daily as needed for moderate pain or severe pain (]).  09/04/14  Yes Historical Provider, MD  LORazepam (ATIVAN) 2 MG  tablet Take 2 mg by mouth 4 (four) times daily as needed for anxiety.    Yes Historical Provider, MD  mirtazapine (REMERON) 15 MG tablet Take 15 mg by mouth at bedtime. 09/11/14  Yes Historical Provider, MD   BP 129/84 mmHg  Pulse 72  Temp(Src) 97.4 F (36.3 C)  Resp 17  Ht 5\' 7"  (1.702 m)  Wt 145 lb (65.772 kg)  BMI 22.71 kg/m2  SpO2 96% Physical Exam  1705: Physical examination:  Nursing notes reviewed; Vital signs and O2 SAT reviewed;  Constitutional: Well developed, Well nourished, Well hydrated, Uncomfortable appearing.; Head:  Normocephalic, atraumatic; Eyes: EOMI, PERRL, No scleral icterus; ENMT: Mouth and pharynx normal, Mucous membranes moist; Neck: Supple, Full range of motion, No lymphadenopathy; Cardiovascular: Regular rate and rhythm, No gallop; Respiratory: Breath sounds clear & equal bilaterally, No wheezes.  Speaking full sentences with ease, Normal respiratory effort/excursion; Chest: +left lower anterior-lateral chest wall tender to palp. No rash, no deformity, no soft tissue crepitus. Movement normal; Abdomen: Soft, Nontender, Nondistended, Normal bowel sounds; Genitourinary: No CVA tenderness; Extremities: Pulses normal, No tenderness, No edema, No calf edema or asymmetry.; Neuro: AA&Ox3, Major CN grossly intact.  Speech clear. No gross focal motor or sensory deficits in extremities.; Skin: Color normal, Warm, Dry.   ED Course  Procedures (including critical care time) Labs Review  Imaging Review  I have personally reviewed and evaluated these images and lab results as part of my medical decision-making.   EKG Interpretation   Date/Time:  Friday September 29 2015 16:18:11 EST Ventricular Rate:  91 PR Interval:  142 QRS Duration: 74 QT Interval:  324 QTC Calculation: 398 R Axis:   74 Text Interpretation:  Normal sinus rhythm T wave abnormality Inferolateral  leads Baseline wander When compared with ECG of 03/12/2011 T wave  abnormality is now Present Confirmed by  [facility]  MD, Nicholos Johns [phone]) on  09/29/2015 6:54:17 PM      MDM  MDM Reviewed: previous chart, nursing note and vitals Reviewed previous: ECG and labs Interpretation: labs, ECG, x-ray and CT scan      Results for orders placed or performed during the hospital encounter of 09/29/15  Basic metabolic panel  Result Value Ref Range   Sodium 139 135 - 145 mmol/L   Potassium 3.9 3.5 - 5.1 mmol/L   Chloride 105 101 - 111 mmol/L   CO2 26 22 - 32 mmol/L   Glucose, Bld 97 65 - 99 mg/dL   BUN 8 6 - 20 mg/dL   Creatinine, Ser 6.04 0.44 - 1.00 mg/dL   Calcium 9.4 8.9 - 54.0 mg/dL   GFR calc non Af Amer >60 >60 mL/min   GFR calc Af Amer >60 >60 mL/min   Anion gap 8 5 - 15  CBC  Result Value Ref Range   WBC 6.8 4.0 - 10.5 K/uL  RBC 4.22 3.87 - 5.11 MIL/uL   Hemoglobin 13.1 12.0 - 15.0 g/dL   HCT 16.1 09.6 - 04.5 %   MCV 94.3 78.0 - 100.0 fL   MCH 31.0 26.0 - 34.0 pg   MCHC 32.9 30.0 - 36.0 g/dL   RDW 40.9 81.1 - 91.4 %   Platelets 195 150 - 400 K/uL  Troponin I  Result Value Ref Range   Troponin I <0.03 <0.031 ng/mL  D-dimer, quantitative  Result Value Ref Range   D-Dimer, Quant 1.07 (H) 0.00 - 0.50 ug/mL-FEU   Dg Chest 2 View 09/29/2015  CLINICAL DATA:  LEFT rib pain for 1.5 weeks worse with deep breathing, shortness of breath, mild chronic cough, history COPD, hyperlipidemia, smoker EXAM: CHEST  2 VIEW COMPARISON:  01/06/2015 FINDINGS: Normal heart size, mediastinal contours, and pulmonary vascularity. Lungs hyperinflated with minimal biapical scarring. No pulmonary infiltrate, pleural effusion or pneumothorax. Prior cervicothoracic fusion. Bones demineralized. IMPRESSION: Hyperinflated lungs with minimal biapical scarring consistent with history of COPD. No acute infiltrate. Electronically Signed   By: Ulyses Southward M.D.   On: 09/29/2015 16:43   Dg Ribs Unilateral Left 09/29/2015  CLINICAL DATA:  Left-sided chest pain for 10 days EXAM: LEFT RIBS - 3+ VIEW COMPARISON:  Chest  radiograph September 29, 2015 FINDINGS: Frontal, bilateral oblique, and cone-down lower rib images were obtained. The visualized lung regions are clear. The heart size and pulmonary vascularity are normal. No adenopathy evident in visualized regions. No demonstrable left-sided pneumothorax or pleural effusion. There is no demonstrable rib fracture. There is postoperative change in the lower cervical spine region. IMPRESSION: Visualized lung regions are clear.  No demonstrable rib fracture. Electronically Signed   By: Bretta Bang III M.D.   On: 09/29/2015 17:34   Ct Angio Chest Pe W/cm &/or Wo Cm 09/29/2015  CLINICAL DATA:  Initial evaluation for 1.5 week history of left-sided rib pain. EXAM: CT ANGIOGRAPHY CHEST WITH CONTRAST TECHNIQUE: Multidetector CT imaging of the chest was performed using the standard protocol during bolus administration of intravenous contrast. Multiplanar CT image reconstructions and MIPs were obtained to evaluate the vascular anatomy. CONTRAST:  OMNIPAQUE IOHEXOL 350 MG/ML SOLN COMPARISON:  Prior radiograph from earlier the same day. FINDINGS: Visualized portions of the thyroid are unremarkable. No pathologically enlarged mediastinal, hilar, or axillary lymph nodes identified. Intrathoracic aorta of normal caliber and appearance. Visualized great vessels demonstrate no acute abnormality. No mediastinal hematoma. Heart size within normal limits. No pericardial effusion. Pulmonary arteries are adequately opacified for evaluation. Main pulmonary artery within normal limits for size measuring 2.2 cm in diameter. No filling defect to suggest acute pulmonary embolism. Re-formatted imaging confirms these findings. Lungs are largely clear without focal infiltrate to suggest acute infectious pneumonitis. Minimal linear opacity within the posterior left lower lobe likely reflects atelectasis. No pulmonary edema or pleural effusion. No pneumothorax. There is a 5 mm subpleural nodule within  the peripheral right middle lobe (series 5, image 60). A second 5 mm nodule present within the left lower lobe (series 5, image 61). 3 mm right upper lobe pulmonary nodule present (series 5, image 21). No other pulmonary nodule or mass. Visualized portions of the upper abdomen are unremarkable. No acute osseous abnormality. No worrisome lytic or blastic osseous lesions. ACDF partially visualized within the lower cervical spine. IMPRESSION: 1. No CT evidence for acute pulmonary embolism or other acute cardiopulmonary abnormality. 2. Few scattered subcentimeter pulmonary nodules measuring up to 5 mm as above. Indeterminate. If the patient is at  high risk for bronchogenic carcinoma, follow-up chest CT at 6-12 months is recommended. If the patient is at low risk for bronchogenic carcinoma, follow-up chest CT at 12 months is recommended. This recommendation follows the consensus statement: Guidelines for Management of Small Pulmonary Nodules Detected on CT Scans: A Statement from the Fleischner Society as published in Radiology 2005;237:395-400. Electronically Signed   By: Rise Mu M.D.   On: 09/29/2015 20:58    2115:  Pt would like to go home now. Doubt PE as cause for symptoms with normal CT-A chest.  Though EKG is changed from previous EKG 5 years ago, pt's troponin is normal after 1.5 weeks of constant symptoms; doubt ACS at this time. Tx symptomatically at this time. Dx and testing d/w pt.  Questions answered.  Verb understanding, agreeable to d/c home with outpt f/u.    Samuel Jester, DO 10/02/15 2203

## 2015-09-29 NOTE — ED Notes (Addendum)
Patient complaining of left sided rib pain for 1 1/2 weeks. Patient denies injury. States pain is worse with deep breathing and movement.

## 2015-09-29 NOTE — ED Notes (Signed)
MD at bedside. 

## 2015-11-13 ENCOUNTER — Other Ambulatory Visit: Payer: Self-pay | Admitting: Internal Medicine

## 2015-11-13 DIAGNOSIS — Z1231 Encounter for screening mammogram for malignant neoplasm of breast: Secondary | ICD-10-CM

## 2015-12-05 ENCOUNTER — Ambulatory Visit: Payer: BLUE CROSS/BLUE SHIELD

## 2015-12-25 ENCOUNTER — Inpatient Hospital Stay: Admission: RE | Admit: 2015-12-25 | Payer: BLUE CROSS/BLUE SHIELD | Source: Ambulatory Visit

## 2016-01-25 ENCOUNTER — Encounter: Payer: Self-pay | Admitting: Internal Medicine

## 2016-01-26 ENCOUNTER — Ambulatory Visit
Admission: RE | Admit: 2016-01-26 | Discharge: 2016-01-26 | Disposition: A | Discharge status: Home / Self Care | Payer: BLUE CROSS/BLUE SHIELD | Source: Ambulatory Visit | Attending: Internal Medicine | Admitting: Internal Medicine

## 2016-01-26 DIAGNOSIS — Z1231 Encounter for screening mammogram for malignant neoplasm of breast: Secondary | ICD-10-CM

## 2016-01-31 ENCOUNTER — Other Ambulatory Visit: Payer: Self-pay | Admitting: Internal Medicine

## 2016-01-31 DIAGNOSIS — R928 Other abnormal and inconclusive findings on diagnostic imaging of breast: Secondary | ICD-10-CM

## 2016-02-08 ENCOUNTER — Other Ambulatory Visit: Payer: BLUE CROSS/BLUE SHIELD

## 2016-02-19 ENCOUNTER — Other Ambulatory Visit: Payer: BLUE CROSS/BLUE SHIELD

## 2016-02-27 ENCOUNTER — Inpatient Hospital Stay: Admission: RE | Admit: 2016-02-27 | Payer: BLUE CROSS/BLUE SHIELD | Source: Ambulatory Visit

## 2016-02-27 ENCOUNTER — Other Ambulatory Visit: Payer: BLUE CROSS/BLUE SHIELD

## 2016-05-17 ENCOUNTER — Emergency Department (HOSPITAL_COMMUNITY)
Admission: EM | Admit: 2016-05-17 | Discharge: 2016-05-17 | Disposition: A | Discharge status: Home / Self Care | Payer: BLUE CROSS/BLUE SHIELD | Attending: Emergency Medicine | Admitting: Emergency Medicine

## 2016-05-17 ENCOUNTER — Encounter (HOSPITAL_COMMUNITY): Payer: Self-pay | Admitting: Emergency Medicine

## 2016-05-17 ENCOUNTER — Emergency Department (HOSPITAL_COMMUNITY): Payer: BLUE CROSS/BLUE SHIELD

## 2016-05-17 DIAGNOSIS — IMO0002 Reserved for concepts with insufficient information to code with codable children: Secondary | ICD-10-CM

## 2016-05-17 DIAGNOSIS — Y93F2 Activity, caregiving, lifting: Secondary | ICD-10-CM | POA: Diagnosis not present

## 2016-05-17 DIAGNOSIS — Y999 Unspecified external cause status: Secondary | ICD-10-CM | POA: Diagnosis not present

## 2016-05-17 DIAGNOSIS — X500XXA Overexertion from strenuous movement or load, initial encounter: Secondary | ICD-10-CM | POA: Diagnosis not present

## 2016-05-17 DIAGNOSIS — J449 Chronic obstructive pulmonary disease, unspecified: Secondary | ICD-10-CM | POA: Diagnosis not present

## 2016-05-17 DIAGNOSIS — F17210 Nicotine dependence, cigarettes, uncomplicated: Secondary | ICD-10-CM | POA: Insufficient documentation

## 2016-05-17 DIAGNOSIS — M545 Low back pain, unspecified: Secondary | ICD-10-CM

## 2016-05-17 DIAGNOSIS — S22080A Wedge compression fracture of T11-T12 vertebra, initial encounter for closed fracture: Secondary | ICD-10-CM | POA: Diagnosis not present

## 2016-05-17 DIAGNOSIS — S3992XA Unspecified injury of lower back, initial encounter: Secondary | ICD-10-CM | POA: Diagnosis present

## 2016-05-17 DIAGNOSIS — Y929 Unspecified place or not applicable: Secondary | ICD-10-CM | POA: Insufficient documentation

## 2016-05-17 MED ORDER — CYCLOBENZAPRINE HCL 10 MG PO TABS: 10.0000 mg | ORAL_TABLET | Freq: Two times a day (BID) | ORAL | 0 refills | Status: DC | PRN

## 2016-05-17 MED ORDER — CYCLOBENZAPRINE HCL 10 MG PO TABS
10.0000 mg | ORAL_TABLET | Freq: Once | ORAL | Status: AC
  Administered 2016-05-17: 10 mg via ORAL
  Filled 2016-05-17: qty 1

## 2016-05-17 NOTE — ED Notes (Signed)
Up to bathroom with assistance.

## 2016-05-17 NOTE — ED Triage Notes (Signed)
Pt reports she was moving furniture on Tuesday. Was lifting a table and had sudden sharp pain in her low back. Pt reports radiation to both hips. Denies problems with bowel or bladder.

## 2016-05-17 NOTE — Discharge Instructions (Signed)
You were seen in the ED today with a small compression fracture in one of the bones in your back. We have evaluated you closely and treated your pain. You will need to continue wearing the brace and follow up with the Neurosurgery team on Monday to schedule an appointment. Sometimes surgery is needed for these injuries and other times it is not.   Return to the ED with any new or worsening pain, fever, chills, weakness, or numbness in the legs.

## 2016-05-17 NOTE — ED Provider Notes (Signed)
Emergency Department Provider Note   By signing my name below, I, Linna DarnerRussell Turner, attest that this documentation has been prepared under the direction and in the presence of physician practitioner, Maia PlanJoshua G Royanne Warshaw, MD. Electronically Signed: Linna Darnerussell Turner, Scribe. 05/17/2016. 8:26 PM.  I have reviewed the triage vital signs and the nursing notes.   HISTORY  Chief Complaint Back Pain   HPI Comments: Rebecca Cohen is a 60 y.o. female who presents to the Emergency Department complaining of sudden onset, constant, bilateral lower back pain beginning 3 days ago. Pt reports she was moving a table and experienced a sharp pain in her lower back while trying to pull it. She notes her back pain has been worse on the right side and worse on the left side at times. Pt endorses pain exacerbation with certain positions. She denies pain radiation into her legs. Pt denies h/o problems with her lower back but notes a h/o cervical disc surgery. Pt notes she has pain medication at home for her cervical spine, but this has not improved her lower back pain. She denies numbness/weakness of her legs, bladder/bowel incontinence, difficulty urinating, abdominal pain, chest pain, SOB, fever, chills, neuro deficits, or any other associated symptoms.  Past Medical History:  Diagnosis Date  . COPD (chronic obstructive pulmonary disease) (HCC)   . Depression   . GERD (gastroesophageal reflux disease)    pt sts sfter egd with ed and diet change is on no meds and has no more problems with this  . Hyperlipidemia   . Osteopenia    previously on Fosamax    Patient Active Problem List   Diagnosis Date Noted  . Anemia due to other cause 09/23/2014  . Leukocytosis 09/23/2014  . Cellulitis of buttock, right 09/22/2014  . Cellulitis of right buttock 09/22/2014  . Family hx of colon cancer 02/27/2011  . Other constipation 02/27/2011    Past Surgical History:  Procedure Laterality Date  . ABDOMINAL HYSTERECTOMY      1990's  . CERVICAL DISC SURGERY    . COLONOSCOPY  03/14/2011   Procedure: COLONOSCOPY;  Surgeon: Corbin Adeobert M Rourk, MD;  Location: [facility];  Service: Gastroenterology;  Laterality: N/A;  In the cecum at 1332.  Total withdrawal time 8    minutes.  . 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  . s/p hysterectomy      Current Outpatient Rx  . Order #: [id number] Class: Historical Med  . Order #: [id number] Class: Historical Med  . Order #: [id number] Class: Print  . Order #: [id number] Class: Historical Med  . Order #: [id number] Class: Print  . Order #: [id number] Class: Historical Med  . Order #: [id number] Class: Print  . Order #: [id number] Class: Historical Med    Allergies Bextra [valdecoxib] and Sulfa drugs cross reactors  Family History  Problem Relation Age of Onset  . Colon cancer Brother 50    deceased  . Anesthesia problems Neg Hx   . Hypotension Neg Hx   . Malignant hyperthermia Neg Hx   . Pseudochol deficiency Neg Hx     Social History Social History  Substance Use Topics  . Smoking status: Current Every Day Smoker    Packs/day: 0.50    Years: 20.00    Types: Cigarettes  . Smokeless tobacco: Never Used     Comment: 1/2 pack a day  . Alcohol use No    Review of Systems  Constitutional: No fever/chills Eyes: No visual changes. ENT: No sore throat. Cardiovascular:  Denies chest pain. Respiratory: Denies shortness of breath. Gastrointestinal: No abdominal pain.  No nausea, no vomiting.  No diarrhea.  No constipation. Genitourinary: Negative for dysuria. Musculoskeletal: Positive for back pain (lower) Skin: Negative for rash. Neurological: Negative for headaches, focal weakness or numbness.  10-point ROS otherwise negative.  ____________________________________________   PHYSICAL EXAM:  VITAL SIGNS: ED Triage Vitals  Enc Vitals Group     BP 05/17/16 1812 126/79     Pulse Rate 05/17/16 1812 67     Resp 05/17/16  1812 18     Temp 05/17/16 1812 98.1 F (36.7 C)     Temp Source 05/17/16 1812 Oral     SpO2 05/17/16 1812 97 %     Weight 05/17/16 1812 136 lb (61.7 kg)     Height 05/17/16 1812 5\' 7"  (1.702 m)     Pain Score 05/17/16 1813 10   Constitutional: Alert and oriented. Well appearing and in no acute distress. Eyes: Conjunctivae are normal. Head: Atraumatic. Nose: No congestion/rhinnorhea. Mouth/Throat: Mucous membranes are moist.  Oropharynx non-erythematous. Neck: No stridor.   Cardiovascular: Normal rate, regular rhythm. Good peripheral circulation. Grossly normal heart sounds.   Respiratory: Normal respiratory effort.  No retractions. Lungs CTAB. Gastrointestinal: Soft and nontender. No distention.  Musculoskeletal: No lower extremity tenderness nor edema. No gross deformities of extremities. Tenderness to palpation along the thoracic and lumbar spine.  Neurologic:  Normal speech and language. No gross focal neurologic deficits are appreciated.  Skin:  Skin is warm, dry and intact. No rash noted. Psychiatric: Mood and affect are normal. Speech and behavior are normal.  ____________________________________________  RADIOLOGY  Dg Lumbar Spine Complete  Result Date: 05/17/2016 CLINICAL DATA:  Low back pain radiating to both hips for 3 days. Recent lifting injury. EXAM: LUMBAR SPINE - COMPLETE 4+ VIEW COMPARISON:  Chest CT dated 09/29/2015. FINDINGS: There is a mild compression deformity of the T12 vertebral body, suspected to be acute. It is new compared to CT chest dated 09/29/2015. No fracture seen within the lumbar spine. Alignment of the lumbar spine is normal. Intervertebral disc spaces are maintained. Atherosclerotic changes noted along the walls of the infrarenal abdominal aorta. Fairly large amount of stool noted within the colon. Visualized paravertebral soft tissues are otherwise unremarkable. Upper sacrum appears intact and normally aligned. IMPRESSION: 1. Mild compression fracture  deformity of the T12 vertebral body, approximately 10-20% compression anteriorly, of uncertain age but suspected to be acute (not seen on CT chest of 09/29/2015). Posterior portion of the T12 vertebral body is normal in height and there is no associated vertebral body displacement/retropulsion. 2. No fracture or dislocation seen within the lumbar spine. 3. Aortic atherosclerosis. 4. Fairly large amount of stool within the visualized portion of the colon (constipation? ). Electronically Signed   By: Bary Richard M.D.   On: 05/17/2016 18:52    ____________________________________________   PROCEDURES  Procedure(s) performed:   Procedures  None ____________________________________________   INITIAL IMPRESSION / ASSESSMENT AND PLAN / ED COURSE  Pertinent labs & imaging results that were available during my care of the patient were reviewed by me and considered in my medical decision making (see chart for details).  Patient presents to the ED with acute onset back pain. No sciatica. No signs or symptoms of cauda equina of severe cord compression. Patient drove herself here and while she feels uncomfortable is managing pain well. She takes chronic pain medication at home. Will add flexeril. Discussed not to take this with other pain medication  or Ativan. She does have a small, likely acute compression fracture on x-ray. She will follow up with PCP and Neurosurgery as an outpatinet. She is wearing a lumbar brace at this time from home.   At this time, I do not feel there is any life-threatening condition present. I have reviewed and discussed all results (EKG, imaging, lab, urine as appropriate), exam findings with patient. I have reviewed nursing notes and appropriate previous records.  I feel the patient is safe to be discharged home without further emergent workup. Discussed usual and customary return precautions. Patient and family (if present) verbalize understanding and are comfortable with  this plan.  Patient will follow-up with their primary care provider. If they do not have a primary care provider, information for follow-up has been provided to them. All questions have been answered.   Documentation performed with the assistance of a scribe. Reviewed all documentation and made changes as necessary.  __________________________________________  FINAL CLINICAL IMPRESSION(S) / ED DIAGNOSES  Final diagnoses:  Compression fracture  Midline low back pain without sciatica     MEDICATIONS GIVEN DURING THIS VISIT:  Medications  cyclobenzaprine (FLEXERIL) tablet 10 mg (10 mg Oral Given 05/17/16 2103)     NEW OUTPATIENT MEDICATIONS STARTED DURING THIS VISIT:  Discharge Medication List as of 05/17/2016  8:57 PM    START taking these medications   Details  cyclobenzaprine (FLEXERIL) 10 MG tablet Take 1 tablet (10 mg total) by mouth 2 (two) times daily as needed for muscle spasms., Starting Fri 05/17/2016, Print          Note:  This document was prepared using Dragon voice recognition software and may include unintentional dictation errors.  Alona Bene, MD Emergency Medicine    Maia Plan, MD 05/18/16 [phone]

## 2016-12-19 ENCOUNTER — Other Ambulatory Visit (HOSPITAL_COMMUNITY): Payer: Self-pay | Admitting: Internal Medicine

## 2016-12-19 DIAGNOSIS — E2839 Other primary ovarian failure: Secondary | ICD-10-CM

## 2016-12-23 ENCOUNTER — Ambulatory Visit (HOSPITAL_COMMUNITY)
Admission: RE | Admit: 2016-12-23 | Discharge: 2016-12-23 | Disposition: A | Discharge status: Home / Self Care | Payer: BLUE CROSS/BLUE SHIELD | Source: Ambulatory Visit | Attending: Internal Medicine | Admitting: Internal Medicine

## 2016-12-23 DIAGNOSIS — E2839 Other primary ovarian failure: Secondary | ICD-10-CM | POA: Insufficient documentation

## 2016-12-23 DIAGNOSIS — Z13820 Encounter for screening for osteoporosis: Secondary | ICD-10-CM | POA: Insufficient documentation

## 2016-12-23 DIAGNOSIS — M810 Age-related osteoporosis without current pathological fracture: Secondary | ICD-10-CM | POA: Insufficient documentation

## 2018-12-28 ENCOUNTER — Ambulatory Visit (HOSPITAL_COMMUNITY)
Admission: RE | Admit: 2018-12-28 | Discharge: 2018-12-28 | Disposition: A | Discharge status: Home / Self Care | Payer: Self-pay | Source: Ambulatory Visit | Attending: Internal Medicine | Admitting: Internal Medicine

## 2018-12-28 ENCOUNTER — Other Ambulatory Visit (HOSPITAL_COMMUNITY): Payer: Self-pay | Admitting: Internal Medicine

## 2018-12-28 ENCOUNTER — Other Ambulatory Visit: Payer: Self-pay

## 2018-12-28 DIAGNOSIS — R05 Cough: Secondary | ICD-10-CM

## 2018-12-28 DIAGNOSIS — R059 Cough, unspecified: Secondary | ICD-10-CM

## 2020-02-24 ENCOUNTER — Encounter (HOSPITAL_COMMUNITY): Payer: Self-pay

## 2020-02-24 ENCOUNTER — Other Ambulatory Visit: Payer: Self-pay

## 2020-02-24 NOTE — Progress Notes (Signed)
[facility] [address], [place] [address]   CLINIC:  Medical Oncology/Hematology  Patient Care Team: Elfredia Nevins, MD as PCP - General (Internal Medicine) Jena Gauss, Gerrit Friends, MD (Gastroenterology)  CHIEF COMPLAINTS/PURPOSE OF CONSULTATION:  Evaluation of leukocytosis  HISTORY OF PRESENTING ILLNESS:  Rebecca Cohen 64 y.o. female is here because of evaluation of leukocytosis, at the request of Dr. Elfredia Nevins at [facility]. She is noted to have chronically elevated WBC count.  Today she reports that she is feeling generally well. She has not been using steroids recently. She had a pneumonia infections on 12/28/2018 and was treated with antibiotics. She had a tick taken off less than a year ago and didn't develop any rashes or symptoms. She denies having F/C, but has hot flashes.  She continues smoking 1 PPD for about 30 years, but is trying to quit. 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 Her brother deceased from colon cancer. She has not received the COVID vaccine yet.   MEDICAL HISTORY:  Past Medical History:  Diagnosis Date  . COPD (chronic obstructive pulmonary disease) (HCC)   . Depression   . GERD (gastroesophageal reflux disease)    pt sts sfter egd with ed and diet change is on no meds and has no more problems with this  . Hyperlipidemia   . Osteopenia    previously on Fosamax    SURGICAL HISTORY: Past Surgical History:  Procedure Laterality Date  . ABDOMINAL HYSTERECTOMY     1990's  . CERVICAL DISC SURGERY    . COLONOSCOPY  03/14/2011   Procedure: COLONOSCOPY;  Surgeon: Corbin Ade, MD;  Location: [facility];  Service: Gastroenterology;  Laterality: N/A;  In the cecum at 1332.  Total withdrawal time 8    minutes.  . 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  . s/p hysterectomy      SOCIAL HISTORY: Social History   Socioeconomic History  .  Marital status: Married    Spouse name: Not on file  . Number of children: 3  . Years of education: Not on file  . Highest education level: Not on file  Occupational History  . 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 Smoking status: Current Every Day Smoker    Packs/day: 0.50    Years: 20.00    Pack years: 10.00    Types: Cigarettes  . Smokeless tobacco: Never Used  . Tobacco comment: 1/2 pack a day  Substance and Sexual Activity  . Alcohol use: No  . Drug use: No  . Sexual activity: Yes    Birth control/protection: Surgical  Other Topics Concern  . Not on file  Social History Narrative  . Not on file   Social Determinants of Health   Financial Resource Strain:   . Difficulty of Paying Living Expenses:   Food Insecurity:   . Worried About Programme researcher, broadcasting/film/video in the Last Year:   . Barista in the Last Year:   Transportation Needs:   . Freight forwarder (Medical):   Marland Kitchen Lack of Transportation (Non-Medical):   Physical Activity:   . Days of Exercise per Week:   . Minutes of Exercise per Session:   Stress:   . Feeling of Stress :   Social Connections:   . Frequency of Communication with Friends and Family:   . Frequency  of Social Gatherings with Friends and Family:   . Attends Religious Services:   . Active Member of Clubs or Organizations:   . Attends Banker Meetings:   Marland Kitchen Marital Status:   Intimate Partner Violence:   . Fear of Current or Ex-Partner:   . Emotionally Abused:   Marland Kitchen Physically Abused:   . Sexually Abused:     FAMILY HISTORY: Family History  Problem Relation Age of Onset  . Colon cancer Brother 50       deceased  . Diabetes Father   . Lupus Sister   . Anesthesia problems Neg Hx   . Hypotension Neg Hx   . Malignant hyperthermia Neg Hx   . Pseudochol deficiency Neg Hx     ALLERGIES:  is allergic to bextra [valdecoxib] and sulfa drugs cross reactors.  MEDICATIONS:  Current  Outpatient Medications  Medication Sig Dispense Refill  . aspirin 81 MG chewable tablet Chew 81 mg by mouth daily.    . calcium carbonate (OSCAL) 1500 (600 Ca) MG TABS tablet Take 600 mg of elemental calcium by mouth 2 (two) times daily with a meal.    . diphenhydrAMINE (BENADRYL) 25 MG tablet Take 25 mg by mouth daily as needed for allergies.     Marland Kitchen ibuprofen (ADVIL) 200 MG tablet Take 200 mg by mouth every 6 (six) hours as needed.    . Multiple Vitamin (MULTIVITAMIN) tablet Take 1 tablet by mouth daily.    Marland Kitchen acyclovir (ZOVIRAX) 400 MG tablet Take 400 mg by mouth 5 (five) times daily. When flare up occurs (Patient not taking: Reported on 02/24/2020)    . cholecalciferol (VITAMIN D3) 25 MCG (1000 UNIT) tablet Take 1,000 Units by mouth daily.    Marland Kitchen HYDROcodone-acetaminophen (NORCO/VICODIN) 5-325 MG tablet 1 or 2 tabs PO q6 hours prn pain 20 tablet 0  . LORazepam (ATIVAN) 2 MG tablet Take 2 mg by mouth 4 (four) times daily as needed for anxiety.     . mirtazapine (REMERON) 15 MG tablet Take 15 mg by mouth at bedtime.  11   No current facility-administered medications for this visit.    REVIEW OF SYSTEMS:   Review of Systems  Constitutional: Positive for appetite change (moderately decreased) and fatigue (depleted).  Cardiovascular: Positive for leg swelling (feet & ankles).  Musculoskeletal: Positive for arthralgias (R knee pain) and back pain (7/10 lower back).  Skin: Negative for rash.  Neurological: Positive for dizziness, headaches and numbness (fingertips & toes).  Psychiatric/Behavioral: Positive for sleep disturbance. The patient is nervous/anxious.   All other systems reviewed and are negative.    PHYSICAL EXAMINATION: ECOG PERFORMANCE STATUS: 0 - Asymptomatic  Vitals:   02/25/20 1408  BP: 118/66  Pulse: 91  Resp: 16  Temp: 98.9 F (37.2 C)  SpO2: 99%   Filed Weights   02/25/20 1408  Weight: 163 lb 1.6 oz (74 kg)   Physical Exam Vitals reviewed.  Constitutional:       Appearance: Normal appearance.  Cardiovascular:     Rate and Rhythm: Normal rate and regular rhythm.     Pulses: Normal pulses.     Heart sounds: Normal heart sounds.  Pulmonary:     Effort: Pulmonary effort is normal.     Breath sounds: Normal breath sounds.  Abdominal:     Palpations: Abdomen is soft. There is no hepatomegaly, splenomegaly or mass.     Tenderness: There is no abdominal tenderness.  Lymphadenopathy:     Cervical: No cervical adenopathy.  Upper Body:     Right upper body: No supraclavicular or axillary adenopathy.     Left upper body: No supraclavicular or axillary adenopathy.  Neurological:     General: No focal deficit present.     Mental Status: She is alert and oriented to person, place, and time.  Psychiatric:        Mood and Affect: Mood normal.        Behavior: Behavior normal.      LABORATORY DATA:  I have reviewed the data as listed No results found for this or any previous visit (from the past 2160 hour(s)).  RADIOGRAPHIC STUDIES: I have personally reviewed the radiological images as listed and agreed with the findings in the report.  ASSESSMENT:  1.  Leukocytosis: -Patient seen at the request of Lenise Herald, PA for leukocytosis. -She recently had elevated white count, more than 15,000 on the most recent lab, according to the office notes dated 02/14/2020. -She denies any fevers, night sweats or weight loss.  She does have hot flashes.  No recurrent infections. -Tick bite reported in the right forearm 1 year ago with no target rash. -Smoked less than a pack a day for 30 years, current active smoker. -No systemic steroid use.  No prior history of splenectomy. -She had elevated white count once in February 2016.  2.  Family history: -Brother had colon cancer at age 61. -She had colonoscopy in 2012 which was normal.   PLAN:  1.  Leukocytosis: -Will check an LDH level.  We will repeat CBC with differential. -If she has persistent  leukocytosis, will consider further work-up including flow cytometry.  We will also consider JAK2 and BCR/ABL if there is elevated neutrophil count. -Differential diagnosis includes smoking-related leukocytosis. -We will see her back in 2 to 3 weeks to discuss results.   All questions were answered. The patient knows to call the clinic with any problems, questions or concerns.   Doreatha Massed, MD 02/25/20 2:57 PM  [facility] [phone]   I, Drue Second, am acting as a scribe for Dr. Payton Mccallum.  I, Doreatha Massed MD, have reviewed the above documentation for accuracy and completeness, and I agree with the above.

## 2020-02-25 ENCOUNTER — Encounter (HOSPITAL_COMMUNITY): Payer: Self-pay | Admitting: Hematology

## 2020-02-25 ENCOUNTER — Inpatient Hospital Stay (HOSPITAL_COMMUNITY): Payer: Self-pay | Attending: Hematology | Admitting: Hematology

## 2020-02-25 ENCOUNTER — Inpatient Hospital Stay (HOSPITAL_COMMUNITY): Payer: Self-pay

## 2020-02-25 VITALS — BP 118/66 | HR 91 | Temp 98.9°F | Resp 16 | Ht 66.0 in | Wt 163.1 lb

## 2020-02-25 DIAGNOSIS — F329 Major depressive disorder, single episode, unspecified: Secondary | ICD-10-CM | POA: Insufficient documentation

## 2020-02-25 DIAGNOSIS — J449 Chronic obstructive pulmonary disease, unspecified: Secondary | ICD-10-CM | POA: Insufficient documentation

## 2020-02-25 DIAGNOSIS — M8580 Other specified disorders of bone density and structure, unspecified site: Secondary | ICD-10-CM | POA: Insufficient documentation

## 2020-02-25 DIAGNOSIS — E785 Hyperlipidemia, unspecified: Secondary | ICD-10-CM | POA: Insufficient documentation

## 2020-02-25 DIAGNOSIS — D72829 Elevated white blood cell count, unspecified: Secondary | ICD-10-CM | POA: Insufficient documentation

## 2020-02-25 DIAGNOSIS — F17210 Nicotine dependence, cigarettes, uncomplicated: Secondary | ICD-10-CM | POA: Insufficient documentation

## 2020-02-25 LAB — LACTATE DEHYDROGENASE: LDH: 118 U/L (ref 98–192)

## 2020-02-25 LAB — CBC WITH DIFFERENTIAL/PLATELET
Abs Immature Granulocytes: 0.03 10*3/uL (ref 0.00–0.07)
Basophils Absolute: 0 10*3/uL (ref 0.0–0.1)
Basophils Relative: 0 %
Eosinophils Absolute: 0.2 10*3/uL (ref 0.0–0.5)
Eosinophils Relative: 2 %
HCT: 40.6 % (ref 36.0–46.0)
Hemoglobin: 13 g/dL (ref 12.0–15.0)
Immature Granulocytes: 0 %
Lymphocytes Relative: 38 %
Lymphs Abs: 4.2 10*3/uL — ABNORMAL HIGH (ref 0.7–4.0)
MCH: 30.6 pg (ref 26.0–34.0)
MCHC: 32 g/dL (ref 30.0–36.0)
MCV: 95.5 fL (ref 80.0–100.0)
Monocytes Absolute: 0.7 10*3/uL (ref 0.1–1.0)
Monocytes Relative: 6 %
Neutro Abs: 6 10*3/uL (ref 1.7–7.7)
Neutrophils Relative %: 54 %
Platelets: 273 10*3/uL (ref 150–400)
RBC: 4.25 MIL/uL (ref 3.87–5.11)
RDW: 13.1 % (ref 11.5–15.5)
WBC: 11.2 10*3/uL — ABNORMAL HIGH (ref 4.0–10.5)
nRBC: 0 % (ref 0.0–0.2)

## 2020-02-25 NOTE — Patient Instructions (Addendum)
[facility] at [facility] Discharge Instructions  You were seen today by Dr. Ellin Saba. He went over your recent results and possible diagnoses. You had blood drawn today for further analysis. Dr. Ellin Saba will see you back in 3-4 weeks for labs and follow up.   Thank you for choosing [facility] at [facility] to provide your oncology and hematology care.  To afford each patient quality time with our provider, please arrive at least 15 minutes before your scheduled appointment time.   If you have a lab appointment with the Cancer Center please come in thru the Main Entrance and check in at the main information desk  You need to re-schedule your appointment should you arrive 10 or more minutes late.  We strive to give you quality time with our providers, and arriving late affects you and other patients whose appointments are after yours.  Also, if you no show three or more times for appointments you may be dismissed from the clinic at the providers discretion.     Again, thank you for choosing [facility].  Our hope is that these requests will decrease the amount of time that you wait before being seen by our physicians.       _____________________________________________________________  Should you have questions after your visit to [facility], please contact our office at [phone] between the hours of 8:00 a.m. and 4:30 p.m.  Voicemails left after 4:00 p.m. will not be returned until the following business day.  For prescription refill requests, have your pharmacy contact our office and allow 72 hours.    Cancer Center Support Programs:   > Cancer Support Group  2nd Tuesday of the month 1pm-2pm, Journey Room

## 2020-03-22 ENCOUNTER — Ambulatory Visit (HOSPITAL_COMMUNITY): Payer: Self-pay | Admitting: Hematology

## 2020-04-09 ENCOUNTER — Emergency Department (HOSPITAL_COMMUNITY): Admission: EM | Admit: 2020-04-09 | Discharge: 2020-04-09 | Discharge status: Against Medical Advice | Payer: Self-pay

## 2020-04-18 ENCOUNTER — Inpatient Hospital Stay (HOSPITAL_COMMUNITY): Payer: Self-pay | Attending: Hematology | Admitting: Hematology

## 2020-05-15 ENCOUNTER — Inpatient Hospital Stay (HOSPITAL_COMMUNITY): Payer: Self-pay

## 2020-05-15 ENCOUNTER — Inpatient Hospital Stay (HOSPITAL_COMMUNITY): Payer: Self-pay | Attending: Hematology | Admitting: Hematology

## 2020-05-15 ENCOUNTER — Other Ambulatory Visit: Payer: Self-pay

## 2020-05-15 ENCOUNTER — Encounter (HOSPITAL_COMMUNITY): Payer: Self-pay | Admitting: Hematology

## 2020-05-15 VITALS — BP 133/84 | HR 87 | Temp 96.8°F | Resp 18 | Wt 158.1 lb

## 2020-05-15 DIAGNOSIS — J449 Chronic obstructive pulmonary disease, unspecified: Secondary | ICD-10-CM | POA: Insufficient documentation

## 2020-05-15 DIAGNOSIS — M8580 Other specified disorders of bone density and structure, unspecified site: Secondary | ICD-10-CM | POA: Insufficient documentation

## 2020-05-15 DIAGNOSIS — D72829 Elevated white blood cell count, unspecified: Secondary | ICD-10-CM | POA: Insufficient documentation

## 2020-05-15 LAB — CBC WITH DIFFERENTIAL/PLATELET
Abs Immature Granulocytes: 0.04 10*3/uL (ref 0.00–0.07)
Basophils Absolute: 0.1 10*3/uL (ref 0.0–0.1)
Basophils Relative: 0 %
Eosinophils Absolute: 0.2 10*3/uL (ref 0.0–0.5)
Eosinophils Relative: 2 %
HCT: 41.7 % (ref 36.0–46.0)
Hemoglobin: 13.6 g/dL (ref 12.0–15.0)
Immature Granulocytes: 0 %
Lymphocytes Relative: 39 %
Lymphs Abs: 5.4 10*3/uL — ABNORMAL HIGH (ref 0.7–4.0)
MCH: 31 pg (ref 26.0–34.0)
MCHC: 32.6 g/dL (ref 30.0–36.0)
MCV: 95 fL (ref 80.0–100.0)
Monocytes Absolute: 0.9 10*3/uL (ref 0.1–1.0)
Monocytes Relative: 7 %
Neutro Abs: 7.1 10*3/uL (ref 1.7–7.7)
Neutrophils Relative %: 52 %
Platelets: 289 10*3/uL (ref 150–400)
RBC: 4.39 MIL/uL (ref 3.87–5.11)
RDW: 12.4 % (ref 11.5–15.5)
WBC: 13.6 10*3/uL — ABNORMAL HIGH (ref 4.0–10.5)
nRBC: 0 % (ref 0.0–0.2)

## 2020-05-15 NOTE — Progress Notes (Signed)
[facility] [address], [place] [address]   CLINIC:  Medical Oncology/Hematology  PCP:  Rebecca Nevins, MD [address] / [place] [address]  [phone]  REASON FOR VISIT:  Follow-up for leukocytosis  PRIOR THERAPY: None  CURRENT THERAPY: Observation  INTERVAL HISTORY:  [name], a 64 y.o. female, returns for routine follow-up for her leukocytosis. Rebecca Cohen was last seen on 02/25/2020.  Today she reports feeling well and she denies having any recent infections or F/C.    REVIEW OF SYSTEMS:  Review of Systems  Constitutional: Positive for fatigue (mild). Negative for appetite change, chills and fever.  Musculoskeletal: Positive for back pain (5/10 chronic back pain).  All other systems reviewed and are negative.   PAST MEDICAL/SURGICAL HISTORY:  Past Medical History:  Diagnosis Date  . COPD (chronic obstructive pulmonary disease) (HCC)   . Depression   . GERD (gastroesophageal reflux disease)    pt sts sfter egd with ed and diet change is on no meds and has no more problems with this  . Hyperlipidemia   . Osteopenia    previously on Fosamax   Past Surgical History:  Procedure Laterality Date  . ABDOMINAL HYSTERECTOMY     1990's  . CERVICAL DISC SURGERY    . COLONOSCOPY  03/14/2011   Procedure: COLONOSCOPY;  Surgeon: Rebecca Ade, MD;  Location: [facility];  Service: Gastroenterology;  Laterality: N/A;  In the cecum at 1332.  Total withdrawal time 8    minutes.  . 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  . s/p hysterectomy      SOCIAL HISTORY:  Social History   Socioeconomic History  . Marital status: Married    Spouse name: Not on file  . Number of children: 3  . Years of education: Not on file  . Highest education level: Not on file  Occupational History  . 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 Smoking status:  Current Every Day Smoker    Packs/day: 0.50    Years: 20.00    Pack years: 10.00    Types: Cigarettes  . Smokeless tobacco: Never Used  . Tobacco comment: 1/2 pack a day  Substance and Sexual Activity  . Alcohol use: No  . Drug use: No  . Sexual activity: Yes    Birth control/protection: Surgical  Other Topics Concern  . Not on file  Social History Narrative  . Not on file   Social Determinants of Health   Financial Resource Strain:   . Difficulty of Paying Living Expenses: Not on file  Food Insecurity:   . Worried About Programme researcher, broadcasting/film/video in the Last Year: Not on file  . Ran Out of Food in the Last Year: Not on file  Transportation Needs:   . Lack of Transportation (Medical): Not on file  . Lack of Transportation (Non-Medical): Not on file  Physical Activity:   . Days of Exercise per Week: Not on file  . Minutes of Exercise per Session: Not on file  Stress:   . Feeling of Stress : Not on file  Social Connections:   . Frequency of Communication with Friends and Family: Not on file  . Frequency of Social Gatherings with Friends and Family: Not on file  . Attends Religious Services: Not on file  . Active Member of Clubs or Organizations: Not on file  . Attends Club or  Organization Meetings: Not on file  . Marital Status: Not on file  Intimate Partner Violence:   . Fear of Current or Ex-Partner: Not on file  . Emotionally Abused: Not on file  . Physically Abused: Not on file  . Sexually Abused: Not on file    FAMILY HISTORY:  Family History  Problem Relation Age of Onset  . Colon cancer Brother 50       deceased  . Diabetes Father   . Lupus Sister   . Anesthesia problems Neg Hx   . Hypotension Neg Hx   . Malignant hyperthermia Neg Hx   . Pseudochol deficiency Neg Hx     CURRENT MEDICATIONS:  Current Outpatient Medications  Medication Sig Dispense Refill  . acyclovir (ZOVIRAX) 400 MG tablet Take 400 mg by mouth 5 (five) times daily. When flare up occurs      . aspirin 81 MG chewable tablet Chew 81 mg by mouth daily.    . calcium carbonate (OSCAL) 1500 (600 Ca) MG TABS tablet Take 600 mg of elemental calcium by mouth 2 (two) times daily with a meal.    . cholecalciferol (VITAMIN D3) 25 MCG (1000 UNIT) tablet Take 1,000 Units by mouth daily.    . diphenhydrAMINE (BENADRYL) 25 MG tablet Take 25 mg by mouth daily as needed for allergies.     Marland Kitchen HYDROcodone-acetaminophen (NORCO/VICODIN) 5-325 MG tablet 1 or 2 tabs PO q6 hours prn pain 20 tablet 0  . ibuprofen (ADVIL) 200 MG tablet Take 200 mg by mouth every 6 (six) hours as needed.    Marland Kitchen LORazepam (ATIVAN) 2 MG tablet Take 2 mg by mouth 4 (four) times daily as needed for anxiety.     . mirtazapine (REMERON) 15 MG tablet Take 15 mg by mouth at bedtime.  11  . Multiple Vitamin (MULTIVITAMIN) tablet Take 1 tablet by mouth daily.    . Quercetin 250 MG TABS Take 1 tablet by mouth.    . zinc gluconate 50 MG tablet Take 50 mg by mouth daily.     No current facility-administered medications for this visit.    ALLERGIES:  Allergies  Allergen Reactions  . Bextra [Valdecoxib] Anaphylaxis  . Sulfa Drugs Cross Reactors Anaphylaxis and Rash    PHYSICAL EXAM:  Performance status (ECOG): 0 - Asymptomatic  Vitals:   05/15/20 1434  BP: 133/84  Pulse: 87  Resp: 18  Temp: (!) 96.8 F (36 C)  SpO2: 98%   Wt Readings from Last 3 Encounters:  05/15/20 158 lb 1.6 oz (71.7 kg)  02/25/20 163 lb 1.6 oz (74 kg)  05/17/16 136 lb (61.7 kg)   Physical Exam Vitals reviewed.  Constitutional:      Appearance: Normal appearance.  Neurological:     General: No focal deficit present.     Mental Status: She is alert and oriented to person, place, and time.  Psychiatric:        Mood and Affect: Mood normal.        Behavior: Behavior normal.     LABORATORY DATA:  I have reviewed the labs as listed.  CBC Latest Ref Rng & Units 02/25/2020 09/29/2015 09/25/2014  WBC 4.0 - 10.5 K/uL 11.2(H) 6.8 7.5  Hemoglobin 12.0 -  15.0 g/dL 31.5 40.0 8.6(P)  Hematocrit 36 - 46 % 40.6 39.8 29.1(L)  Platelets 150 - 400 K/uL 273 195 274   CMP Latest Ref Rng & Units 09/29/2015 09/23/2014 09/22/2014  Glucose 65 - 99 mg/dL 97 619(J) 093(O)  BUN  6 - 20 mg/dL 8 9 9   Creatinine 0.44 - 1.00 mg/dL 1.02 5.85  Sodium 135 - 145 mmol/L 139 137 138  Potassium 3.5 - 5.1 mmol/L 3.9 3.7 3.9  Chloride 101 - 111 mmol/L 105 110 108  CO2 22 - 32 mmol/L 26 22 24   Calcium 8.9 - 10.3 mg/dL 9.4 8.4 9.5  Total Protein 6.0 - 8.3 g/dL - 6.0 7.6  Total Bilirubin 0.3 - 1.2 mg/dL - 0.4 0.6  Alkaline Phos 39 - 117 U/L - 117 141(H)  AST 0 - 37 U/L - 25 25  ALT 0 - 35 U/L - 33 46(H)      Component Value Date/Time   RBC 4.25 02/25/2020 1527   MCV 95.5 02/25/2020 1527   MCH 30.6 02/25/2020 1527   MCHC 32.0 02/25/2020 1527   RDW 13.1 02/25/2020 1527   LYMPHSABS 4.2 (H) 02/25/2020 1527   MONOABS 0.7 02/25/2020 1527   EOSABS 0.2 02/25/2020 1527   BASOSABS 0.0 02/25/2020 1527   Lab Results  Component Value Date   LDH 118 02/25/2020    DIAGNOSTIC IMAGING:  I have independently reviewed the scans and discussed with the patient. No results found.   ASSESSMENT:  1.  Leukocytosis: -Patient seen at the request of 04/27/2020, PA for leukocytosis. -She recently had elevated white count, more than 15,000 on the most recent lab, according to the office notes dated 02/14/2020. -She denies any fevers, night sweats or weight loss.  She does have hot flashes.  No recurrent infections. -Tick bite reported in the right forearm 1 year ago with no target rash. -Smoked less than a pack a day for 30 years, current active smoker. -No systemic steroid use.  No prior history of splenectomy. -She had elevated white count once in February 2016.  2.  Family history: -Brother had colon cancer at age 36. -She had colonoscopy in 2012 which was normal.   PLAN:  1.    Lymphocytic leukocytosis: -We reviewed CBC from 02/25/2020.  White count was 11.2 with  absolute lymphocyte count of 4200. -LDH was normal. -I have recommended flow cytometry to evaluate for lymphoproliferative disorders because of the elevation of lymphocyte count. -We will plan to follow her up on a phone visit in 1 week.  Orders placed this encounter:  No orders of the defined types were placed in this encounter.    2013, MD [facility] [phone]   I, [facility], am acting as a scribe for Dr. [phone].  I, Drue Second MD, have reviewed the above documentation for accuracy and completeness, and I agree with the above.

## 2020-05-15 NOTE — Patient Instructions (Signed)
[facility] at [facility] Discharge Instructions  You were seen today by Dr. Ellin Saba. He went over your recent results. You had labs drawn for further analysis. Dr. Ellin Saba will contact you in 1 week via telephone for follow up.   Thank you for choosing [facility] at [facility] to provide your oncology and hematology care.  To afford each patient quality time with our provider, please arrive at least 15 minutes before your scheduled appointment time.   If you have a lab appointment with the Cancer Center please come in thru the Main Entrance and check in at the main information desk  You need to re-schedule your appointment should you arrive 10 or more minutes late.  We strive to give you quality time with our providers, and arriving late affects you and other patients whose appointments are after yours.  Also, if you no show three or more times for appointments you may be dismissed from the clinic at the providers discretion.     Again, thank you for choosing [facility].  Our hope is that these requests will decrease the amount of time that you wait before being seen by our physicians.       _____________________________________________________________  Should you have questions after your visit to [facility], please contact our office at [phone] between the hours of 8:00 a.m. and 4:30 p.m.  Voicemails left after 4:00 p.m. will not be returned until the following business day.  For prescription refill requests, have your pharmacy contact our office and allow 72 hours.    Cancer Center Support Programs:   > Cancer Support Group  2nd Tuesday of the month 1pm-2pm, Journey Room

## 2020-05-16 LAB — SURGICAL PATHOLOGY

## 2020-05-23 ENCOUNTER — Inpatient Hospital Stay (HOSPITAL_COMMUNITY): Payer: Self-pay | Attending: Hematology | Admitting: Hematology

## 2020-05-23 ENCOUNTER — Other Ambulatory Visit: Payer: Self-pay

## 2020-05-23 DIAGNOSIS — D72829 Elevated white blood cell count, unspecified: Secondary | ICD-10-CM

## 2020-05-23 NOTE — Progress Notes (Signed)
Virtual Visit via Telephone Note  I connected with Rebecca Cohen on 05/23/20 at  4:15 PM EDT by telephone and verified that I am speaking with the correct person using two identifiers.   I discussed the limitations, risks, security and privacy concerns of performing an evaluation and management service by telephone and the availability of in person appointments. I also discussed with the patient that there may be a patient responsible charge related to this service. The patient expressed understanding and agreed to proceed.   History of Present Illness: She was evaluated for leukocytosis by Collene Mares, PA.  Labs on 02/14/2020 shows white count 15,000.  CBC on 02/25/2020 showed white count 11.2 with normal LDH and elevated absolute lymphocyte count of 4.2.   Observations/Objective: Denies any fevers or night sweats or unintentional weight loss.  Appetite is 50%.  Energy levels are slightly low.  Chronic mid back pain rated 7 out of 10 is stable.  Assessment and Plan:  1.  Lymphocytic leukocytosis: -CBC on 05/15/2020 with white count 13.6 and absolute lymphocyte count of 5400. -Discussed flow cytometry results from 05/15/2020 which was negative for any lymphoproliferative disorders. -No history of steroid use.  No prior history of splenectomy. -Smokes less than a pack a day for 30 years, current active smoker.  This is likely etiology of her leukocytosis. -We discussed and agreed that we will hold off on doing bone marrow biopsy until her CBC changes significantly. -She will come back in 6 months for follow-up with labs. -She reports that she is working on quitting smoking.   Follow Up Instructions: RTC 6 months with labs.   I discussed the assessment and treatment plan with the patient. The patient was provided an opportunity to ask questions and all were answered. The patient agreed with the plan and demonstrated an understanding of the instructions.   The patient was advised to call  back or seek an in-person evaluation if the symptoms worsen or if the condition fails to improve as anticipated.  I provided 11-minutes of non-face-to-face time during this encounter.   Derek Jack, MD

## 2020-07-27 IMAGING — DX CHEST - 2 VIEW
2 series · 2 of 2 positions shown · non-contrast
Comparison: None.

CLINICAL DATA: Cough

EXAM:
CHEST - 2 VIEW

[chest pa]
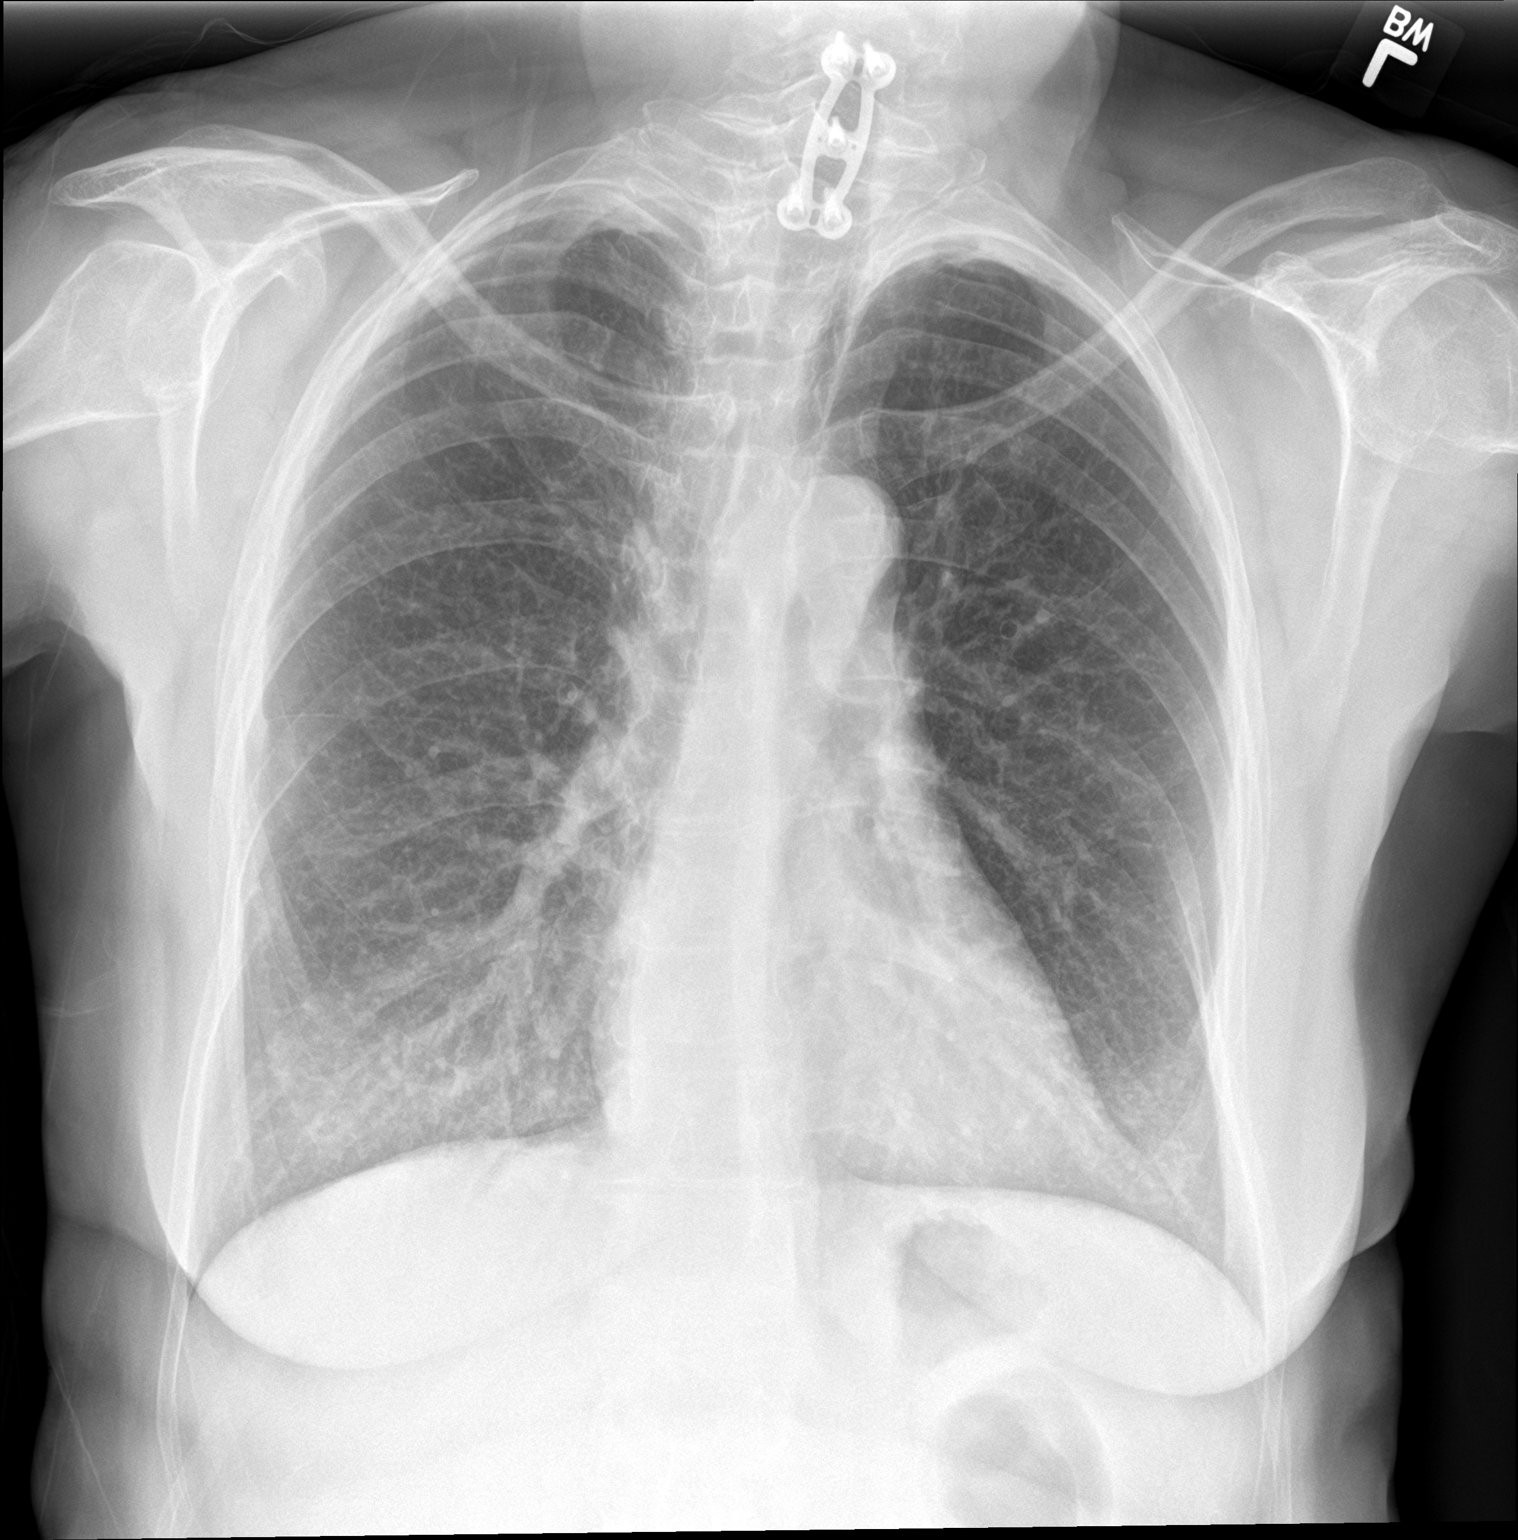

[chest lat]
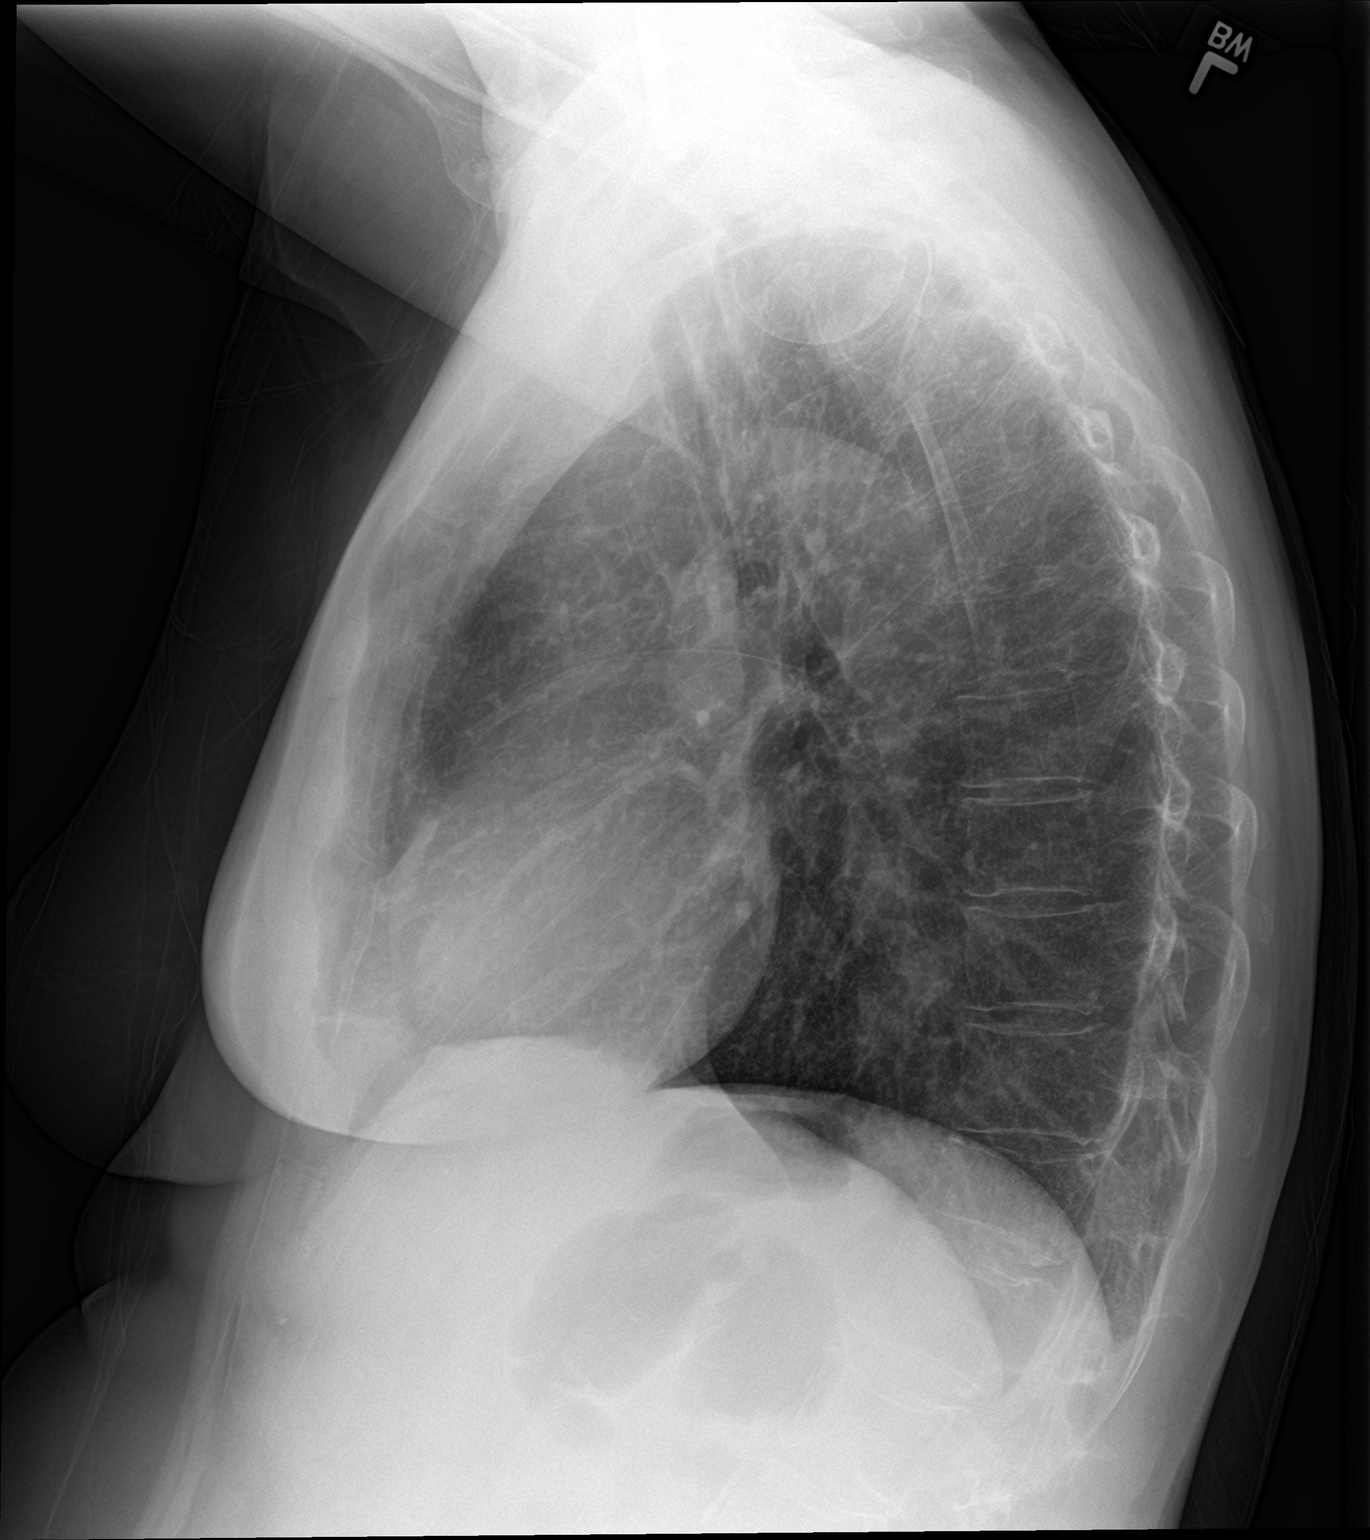

[2 of 2 positions shown; findings below may reference images not displayed]

FINDINGS: There are slightly greater than expected densities at the lung bases
bilaterally, right worse than left. This appears new from prior
studies and appears to be separate from breast tissue attenuation
artifact. There is no pneumothorax. No large pleural effusion. The
cardiac silhouette is stable from prior studies. The lungs remain
somewhat hyperexpanded. There is new height loss of the T12
vertebral body. Osteopenia is noted.
IMPRESSION: 1. Bibasilar airspace opacities, right greater than left. Findings
are suspicious for developing infiltrates versus aspiration.
Follow-up to radiologic resolution is recommended.
2. Age-indeterminate compression fracture of the T12 vertebral body.
This is new since chest x-ray from 09/29/2015.
[DATE]. Mildly hyperexpanded lungs, similar to prior studies.

## 2020-11-13 ENCOUNTER — Other Ambulatory Visit (HOSPITAL_COMMUNITY): Payer: Self-pay | Admitting: Physician Assistant

## 2020-11-13 DIAGNOSIS — M546 Pain in thoracic spine: Secondary | ICD-10-CM

## 2020-11-17 ENCOUNTER — Other Ambulatory Visit: Payer: Self-pay

## 2020-11-17 ENCOUNTER — Ambulatory Visit (HOSPITAL_COMMUNITY)
Admission: RE | Admit: 2020-11-17 | Discharge: 2020-11-17 | Disposition: A | Discharge status: Home / Self Care | Payer: 59 | Source: Ambulatory Visit | Attending: Physician Assistant | Admitting: Physician Assistant

## 2020-11-17 DIAGNOSIS — M546 Pain in thoracic spine: Secondary | ICD-10-CM | POA: Diagnosis not present

## 2020-11-21 ENCOUNTER — Other Ambulatory Visit (HOSPITAL_COMMUNITY): Payer: Self-pay

## 2020-11-22 ENCOUNTER — Inpatient Hospital Stay (HOSPITAL_COMMUNITY): Payer: 59 | Attending: Hematology

## 2020-11-29 ENCOUNTER — Ambulatory Visit (HOSPITAL_COMMUNITY): Payer: Self-pay | Admitting: Hematology

## 2020-12-05 ENCOUNTER — Encounter: Payer: Self-pay | Admitting: Orthopaedic Surgery

## 2020-12-05 ENCOUNTER — Other Ambulatory Visit: Payer: Self-pay

## 2020-12-05 ENCOUNTER — Ambulatory Visit (INDEPENDENT_AMBULATORY_CARE_PROVIDER_SITE_OTHER): Payer: 59 | Admitting: Orthopaedic Surgery

## 2020-12-05 VITALS — BP 149/84 | HR 77 | Ht 66.0 in | Wt 158.0 lb

## 2020-12-05 DIAGNOSIS — F17210 Nicotine dependence, cigarettes, uncomplicated: Secondary | ICD-10-CM | POA: Diagnosis not present

## 2020-12-05 DIAGNOSIS — S32010A Wedge compression fracture of first lumbar vertebra, initial encounter for closed fracture: Secondary | ICD-10-CM | POA: Diagnosis not present

## 2020-12-05 NOTE — Patient Instructions (Signed)

## 2020-12-05 NOTE — Progress Notes (Signed)
Subjective:    Patient ID: Rebecca Cohen, female    DOB: [birth year], 65 y.o.   MRN: [id number]  HPI She has had increasing back pain with no trauma.  She was seen at [facility].  X-rays were done showing:  1. There is a compression fracture of L1 which was not present in May of 2020. This finding is otherwise age indeterminate. The patient's symptoms are described as pain for a year and the finding may not be acute. Recommend clinical correlation. 2. Mild chronic compression fracture of T12, present since at least 2017.  I have independently reviewed and interpreted x-rays of this patient done at another site by another physician or qualified health professional.  She has had more pain over the last few weeks.  I think this is an acute injury.  I will treat with CASH brace.  We will apply it today.  I have explained the findings to her.  I have reviewed the notes from [facility].  I talked to her about Vitamin D and calcium.  She will take picture of the bottle of Vitamin D she takes. I have talked about diet and calcium.   Review of Systems  Constitutional: Positive for activity change.  Musculoskeletal: Positive for arthralgias and back pain.  All other systems reviewed and are negative.  For Review of Systems, all other systems reviewed and are negative.  The following is a summary of the past history medically, past history surgically, known current medicines, social history and family history.  This information is gathered electronically by the computer from prior information and documentation.  I review this each visit and have found including this information at this point in the chart is beneficial and informative.   Past Medical History:  Diagnosis Date  . COPD (chronic obstructive pulmonary disease) (HCC)   . Depression   . GERD (gastroesophageal reflux disease)    pt sts sfter egd with ed and diet change is on no meds and has no more problems with this   . Hyperlipidemia   . Osteopenia    previously on Fosamax    Past Surgical History:  Procedure Laterality Date  . ABDOMINAL HYSTERECTOMY     1990's  . CERVICAL DISC SURGERY    . COLONOSCOPY  03/14/2011   Procedure: COLONOSCOPY;  Surgeon: Corbin Ade, MD;  Location: [facility];  Service: Gastroenterology;  Laterality: N/A;  In the cecum at 1332.  Total withdrawal time 8    minutes.  . 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  . s/p hysterectomy      Current Outpatient Medications on File Prior to Visit  Medication Sig Dispense Refill  . acyclovir (ZOVIRAX) 400 MG tablet Take 400 mg by mouth 5 (five) times daily. When flare up occurs    . alendronate (FOSAMAX) 70 MG tablet Take by mouth.    Marland Kitchen aspirin 81 MG chewable tablet Chew 81 mg by mouth daily.    . calcium carbonate (OSCAL) 1500 (600 Ca) MG TABS tablet Take 600 mg of elemental calcium by mouth 2 (two) times daily with a meal.    . cholecalciferol (VITAMIN D3) 25 MCG (1000 UNIT) tablet Take 1,000 Units by mouth daily.    . diphenhydrAMINE (BENADRYL) 25 MG tablet Take 25 mg by mouth daily as needed for allergies.     Marland Kitchen HYDROcodone-acetaminophen (NORCO/VICODIN) 5-325 MG tablet 1 or 2 tabs PO q6 hours prn pain 20 tablet 0  .  ibuprofen (ADVIL) 200 MG tablet Take 200 mg by mouth every 6 (six) hours as needed.    Marland Kitchen LORazepam (ATIVAN) 2 MG tablet Take 2 mg by mouth 4 (four) times daily as needed for anxiety.     . mirtazapine (REMERON) 30 MG tablet Take 30 mg by mouth at bedtime.    . Multiple Vitamin (MULTIVITAMIN) tablet Take 1 tablet by mouth daily.    . Quercetin 250 MG TABS Take 1 tablet by mouth.    . zinc gluconate 50 MG tablet Take 50 mg by mouth daily.     No current facility-administered medications on file prior to visit.    Social History   Socioeconomic History  . Marital status: Married    Spouse name: Not on file  . Number of children: 3  . Years of education: Not  on file  . Highest education level: Not on file  Occupational History  . 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 Smoking status: Current Every Day Smoker    Packs/day: 0.50    Years: 20.00    Pack years: 10.00    Types: Cigarettes  . Smokeless tobacco: Never Used  . Tobacco comment: 1/2 pack a day  Substance and Sexual Activity  . Alcohol use: No  . Drug use: No  . Sexual activity: Yes    Birth control/protection: Surgical  Other Topics Concern  . Not on file  Social History Narrative  . Not on file   Social Determinants of Health   Financial Resource Strain: Not on file  Food Insecurity: Not on file  Transportation Needs: Not on file  Physical Activity: Not on file  Stress: Not on file  Social Connections: Not on file  Intimate Partner Violence: Not on file    Family History  Problem Relation Age of Onset  . Colon cancer Brother 50       deceased  . Diabetes Father   . Lupus Sister   . Anesthesia problems Neg Hx   . Hypotension Neg Hx   . Malignant hyperthermia Neg Hx   . Pseudochol deficiency Neg Hx     BP (!) 149/84   Pulse 77   Ht 5\' 6"  (1.676 m)   Wt 158 lb (71.7 kg)   BMI 25.50 kg/m   Body mass index is 25.5 kg/m.      Objective:   Physical Exam Vitals and nursing note reviewed. Exam conducted with a chaperone present.  Constitutional:      Appearance: She is well-developed.  HENT:     Head: Normocephalic and atraumatic.  Eyes:     Conjunctiva/sclera: Conjunctivae normal.     Pupils: Pupils are equal, round, and reactive to light.  Cardiovascular:     Rate and Rhythm: Normal rate and regular rhythm.  Pulmonary:     Effort: Pulmonary effort is normal.  Abdominal:     Palpations: Abdomen is soft.  Musculoskeletal:       Arms:     Cervical back: Normal range of motion and neck supple.  Skin:    General: Skin is warm and dry.  Neurological:     Mental Status: She is alert and oriented to person,  place, and time.     Cranial Nerves: No cranial nerve deficit.     Motor: No abnormal muscle tone.     Coordination: Coordination normal.     Deep Tendon Reflexes: Reflexes are normal and symmetric. Reflexes normal.  Psychiatric:  Behavior: Behavior normal.        Thought Content: Thought content normal.        Judgment: Judgment normal.           Assessment & Plan:   Encounter Diagnoses  Name Primary?  . Compression fracture of L1 vertebra, initial encounter (HCC) Yes  . Nicotine dependence, cigarettes, uncomplicated    I have fitted her with CASH brace.  Return in one week.  Call if any problem.  Precautions discussed.   Electronically Signed Darreld Mclean, MD 4/19/20224:17 PM

## 2020-12-12 ENCOUNTER — Ambulatory Visit: Payer: 59 | Admitting: Orthopaedic Surgery

## 2020-12-14 ENCOUNTER — Encounter: Payer: 59 | Admitting: Orthopaedic Surgery

## 2021-01-11 ENCOUNTER — Ambulatory Visit: Payer: 59

## 2021-01-11 ENCOUNTER — Other Ambulatory Visit: Payer: Self-pay

## 2021-01-11 ENCOUNTER — Ambulatory Visit (INDEPENDENT_AMBULATORY_CARE_PROVIDER_SITE_OTHER): Payer: 59 | Admitting: Orthopaedic Surgery

## 2021-01-11 ENCOUNTER — Encounter: Payer: Self-pay | Admitting: Orthopaedic Surgery

## 2021-01-11 VITALS — BP 133/81 | HR 87 | Ht 66.0 in | Wt 162.6 lb

## 2021-01-11 DIAGNOSIS — G8929 Other chronic pain: Secondary | ICD-10-CM

## 2021-01-11 DIAGNOSIS — M25562 Pain in left knee: Secondary | ICD-10-CM | POA: Diagnosis not present

## 2021-01-11 DIAGNOSIS — M25561 Pain in right knee: Secondary | ICD-10-CM | POA: Diagnosis not present

## 2021-01-11 DIAGNOSIS — F17210 Nicotine dependence, cigarettes, uncomplicated: Secondary | ICD-10-CM | POA: Diagnosis not present

## 2021-01-11 DIAGNOSIS — S32010A Wedge compression fracture of first lumbar vertebra, initial encounter for closed fracture: Secondary | ICD-10-CM

## 2021-01-11 NOTE — Progress Notes (Signed)
My brace is fitting ok.  I have knee pain.  She is here to check her CASH brace which she has been using for the last week for compression fracture L1.  The brace fits well.  NV intact.  She has a new problem, bilateral knee pain, worse on the right.  She has swelling, popping and giving way of the right knee.  She has swelling and popping of the left knee but no giving way. She has no trauma, no redness. She is taking Aleve which helps.  She has limp to the right.  Both knees have minimal effusion, crepitus, ROM right 0 to 105, limp right, ROM left 0 to 110.  NV intact.  Right knee has positive medial McMurray.  Encounter Diagnoses  Name Primary?  . Chronic pain of right knee Yes  . Chronic pain of left knee   . Compression fracture of L1 vertebra, initial encounter (HCC)   . Nicotine dependence, cigarettes, uncomplicated    PROCEDURE NOTE:  The patient requests injections of the right knee , verbal consent was obtained.  The right knee was prepped appropriately after time out was performed.   Sterile technique was observed and injection of 1 cc of DepoMedrol 40mg  with several cc's of plain xylocaine. Anesthesia was provided by ethyl chloride and a 20-gauge needle was used to inject the knee area. The injection was tolerated well.  A band aid dressing was applied.  The patient was advised to apply ice later today and tomorrow to the injection sight as needed.   PROCEDURE NOTE:  The patient requests injections of the left knee , verbal consent was obtained.  The left knee was prepped appropriately after time out was performed.   Sterile technique was observed and injection of 1 cc of DepoMedrol 40 mg with several cc's of plain xylocaine. Anesthesia was provided by ethyl chloride and a 20-gauge needle was used to inject the knee area. The injection was tolerated well.  A band aid dressing was applied.  The patient was advised to apply ice later today and tomorrow to the injection  sight as needed.  X-rays were done of both knees, reported separately.  She may need MRI of the right knee.  I told her to take Aleve two bid pc.  Return in three weeks.  X-rays of the lumbar spine laterally.  Call if any problem.  Precautions discussed.   Electronically Signed , MD 5/26/202210:46 AM

## 2021-02-01 ENCOUNTER — Other Ambulatory Visit: Payer: Self-pay

## 2021-02-01 ENCOUNTER — Encounter: Payer: Self-pay | Admitting: Orthopaedic Surgery

## 2021-02-01 ENCOUNTER — Ambulatory Visit: Payer: 59

## 2021-02-01 ENCOUNTER — Ambulatory Visit (INDEPENDENT_AMBULATORY_CARE_PROVIDER_SITE_OTHER): Payer: 59 | Admitting: Orthopaedic Surgery

## 2021-02-01 DIAGNOSIS — S32010A Wedge compression fracture of first lumbar vertebra, initial encounter for closed fracture: Secondary | ICD-10-CM

## 2021-02-01 DIAGNOSIS — S32010D Wedge compression fracture of first lumbar vertebra, subsequent encounter for fracture with routine healing: Secondary | ICD-10-CM

## 2021-02-01 NOTE — Patient Instructions (Signed)
Xray of back on return.

## 2021-02-01 NOTE — Progress Notes (Signed)
I have less pain.  She is wearing her CASH brace and doing well.  She has much less pain.  She has no new trauma, no weakness.  CASH brace is OK.  NV intact.  X-rays were done and reported separately of lumbar spine laterally.   Encounter Diagnosis  Name Primary?   Compression fracture of L1 vertebra with routine healing, subsequent encounter Yes   Gradually wean out of brace.  Call if any problem.  Precautions discussed.  Return in one month.  X-rays then.  Electronically Signed Darreld Mclean, MD 6/16/202210:31 AM

## 2021-03-01 ENCOUNTER — Encounter: Payer: 59 | Admitting: Orthopaedic Surgery

## 2021-03-06 ENCOUNTER — Encounter: Payer: 59 | Admitting: Orthopaedic Surgery

## 2021-03-13 ENCOUNTER — Ambulatory Visit (INDEPENDENT_AMBULATORY_CARE_PROVIDER_SITE_OTHER): Payer: 59 | Admitting: Orthopaedic Surgery

## 2021-03-13 ENCOUNTER — Encounter: Payer: Self-pay | Admitting: Orthopaedic Surgery

## 2021-03-13 ENCOUNTER — Other Ambulatory Visit: Payer: Self-pay

## 2021-03-13 ENCOUNTER — Ambulatory Visit: Payer: 59

## 2021-03-13 DIAGNOSIS — S32010D Wedge compression fracture of first lumbar vertebra, subsequent encounter for fracture with routine healing: Secondary | ICD-10-CM

## 2021-03-13 DIAGNOSIS — G8929 Other chronic pain: Secondary | ICD-10-CM

## 2021-03-13 DIAGNOSIS — M25562 Pain in left knee: Secondary | ICD-10-CM | POA: Diagnosis not present

## 2021-03-13 DIAGNOSIS — M25561 Pain in right knee: Secondary | ICD-10-CM

## 2021-03-13 NOTE — Progress Notes (Signed)
My back is better  She has been in the CASH brace and doing well.  I told her to wean out of it.  NV Intact. X-rays were done of the lateral spine, reported separately.  She has pain in both knees and would like injections today.  Encounter Diagnoses  Name Primary?   Compression fracture of L1 vertebra with routine healing, subsequent encounter Yes   Chronic pain of both knees    PROCEDURE NOTE:  The patient requests injections of the right knee , verbal consent was obtained.  The right knee was prepped appropriately after time out was performed.   Sterile technique was observed and injection of 1 cc of Celestone 6mg  with several cc's of plain xylocaine. Anesthesia was provided by ethyl chloride and a 20-gauge needle was used to inject the knee area. The injection was tolerated well.  A band aid dressing was applied.  The patient was advised to apply ice later today and tomorrow to the injection sight as needed.   PROCEDURE NOTE:  The patient requests injections of the left knee , verbal consent was obtained.  The left knee was prepped appropriately after time out was performed.   Sterile technique was observed and injection of 1 cc of Celestone 6 mg with several cc's of plain xylocaine. Anesthesia was provided by ethyl chloride and a 20-gauge needle was used to inject the knee area. The injection was tolerated well.  A band aid dressing was applied.  The patient was advised to apply ice later today and tomorrow to the injection sight as needed.   Return in six weeks.  Call if any problem.  Precautions discussed.  Electronically Signed , MD 7/26/20223:06 PM

## 2021-04-24 ENCOUNTER — Ambulatory Visit: Payer: 59 | Admitting: Orthopaedic Surgery

## 2021-04-26 ENCOUNTER — Ambulatory Visit: Payer: 59 | Admitting: Orthopaedic Surgery

## 2021-05-10 ENCOUNTER — Encounter: Payer: Self-pay | Admitting: Internal Medicine

## 2021-05-10 LAB — FLOW CYTOMETRY

## 2021-06-13 ENCOUNTER — Ambulatory Visit: Payer: 59

## 2021-06-19 ENCOUNTER — Encounter: Payer: Self-pay | Admitting: Orthopaedic Surgery

## 2021-06-19 ENCOUNTER — Ambulatory Visit (INDEPENDENT_AMBULATORY_CARE_PROVIDER_SITE_OTHER): Payer: 59 | Admitting: Orthopaedic Surgery

## 2021-06-19 ENCOUNTER — Other Ambulatory Visit: Payer: Self-pay

## 2021-06-19 VITALS — BP 131/82 | HR 93 | Ht 67.0 in | Wt 144.6 lb

## 2021-06-19 DIAGNOSIS — S32010D Wedge compression fracture of first lumbar vertebra, subsequent encounter for fracture with routine healing: Secondary | ICD-10-CM

## 2021-06-19 DIAGNOSIS — G8929 Other chronic pain: Secondary | ICD-10-CM

## 2021-06-19 DIAGNOSIS — M25562 Pain in left knee: Secondary | ICD-10-CM

## 2021-06-19 DIAGNOSIS — F17210 Nicotine dependence, cigarettes, uncomplicated: Secondary | ICD-10-CM

## 2021-06-19 DIAGNOSIS — M25561 Pain in right knee: Secondary | ICD-10-CM

## 2021-06-19 NOTE — Progress Notes (Signed)
PROCEDURE NOTE:  The patient requests injections of the left knee , verbal consent was obtained.  The left knee was prepped appropriately after time out was performed.   Sterile technique was observed and injection of 1 cc of DepoMedrol 40 mg with several cc's of plain xylocaine. Anesthesia was provided by ethyl chloride and a 20-gauge needle was used to inject the knee area. The injection was tolerated well.  A band aid dressing was applied.  The patient was advised to apply ice later today and tomorrow to the injection sight as needed.   PROCEDURE NOTE:  The patient requests injections of the right knee , verbal consent was obtained.  The right knee was prepped appropriately after time out was performed.   Sterile technique was observed and injection of 1 cc of DepoMedrol 40mg  with several cc's of plain xylocaine. Anesthesia was provided by ethyl chloride and a 20-gauge needle was used to inject the knee area. The injection was tolerated well.  A band aid dressing was applied.  The patient was advised to apply ice later today and tomorrow to the injection sight as needed.   She has some back pain.  I will have her go to PT.  She has old compression fracture and change in weather is bothering her.  Encounter Diagnoses  Name Primary?   Compression fracture of L1 vertebra with routine healing, subsequent encounter Yes   Chronic pain of both knees    Nicotine dependence, cigarettes, uncomplicated    Return in one month.  Call if any problem.  Precautions discussed.  Electronically Signed , MD 11/1/20222:19 PM

## 2021-06-26 ENCOUNTER — Ambulatory Visit: Payer: 59

## 2021-07-05 ENCOUNTER — Ambulatory Visit (HOSPITAL_COMMUNITY): Payer: 59 | Attending: Orthopedic Surgery | Admitting: Physical Therapy

## 2021-07-17 ENCOUNTER — Ambulatory Visit: Payer: 59 | Admitting: Orthopaedic Surgery

## 2021-07-25 ENCOUNTER — Ambulatory Visit (HOSPITAL_COMMUNITY): Payer: 59 | Attending: Orthopedic Surgery | Admitting: Physical Therapy

## 2021-07-30 ENCOUNTER — Ambulatory Visit: Payer: 59

## 2021-08-22 ENCOUNTER — Ambulatory Visit (HOSPITAL_COMMUNITY): Payer: Medicaid Other | Admitting: Physical Therapy

## 2021-08-30 ENCOUNTER — Ambulatory Visit (HOSPITAL_COMMUNITY): Payer: Medicaid Other | Attending: Orthopedic Surgery | Admitting: Physical Therapy

## 2022-05-15 DIAGNOSIS — Z6824 Body mass index (BMI) 24.0-24.9, adult: Secondary | ICD-10-CM | POA: Diagnosis not present

## 2022-05-15 DIAGNOSIS — M5126 Other intervertebral disc displacement, lumbar region: Secondary | ICD-10-CM | POA: Diagnosis not present

## 2022-05-15 DIAGNOSIS — M5416 Radiculopathy, lumbar region: Secondary | ICD-10-CM | POA: Diagnosis not present

## 2022-05-15 DIAGNOSIS — R002 Palpitations: Secondary | ICD-10-CM | POA: Diagnosis not present

## 2022-05-15 DIAGNOSIS — G894 Chronic pain syndrome: Secondary | ICD-10-CM | POA: Diagnosis not present

## 2022-05-15 DIAGNOSIS — R69 Illness, unspecified: Secondary | ICD-10-CM | POA: Diagnosis not present

## 2022-05-15 DIAGNOSIS — M1991 Primary osteoarthritis, unspecified site: Secondary | ICD-10-CM | POA: Diagnosis not present

## 2022-06-05 ENCOUNTER — Encounter: Payer: Self-pay | Admitting: Orthopedic Surgery

## 2022-06-05 ENCOUNTER — Ambulatory Visit (INDEPENDENT_AMBULATORY_CARE_PROVIDER_SITE_OTHER): Payer: 59

## 2022-06-05 ENCOUNTER — Ambulatory Visit: Payer: Self-pay

## 2022-06-05 ENCOUNTER — Ambulatory Visit (INDEPENDENT_AMBULATORY_CARE_PROVIDER_SITE_OTHER): Payer: 59 | Admitting: Surgical

## 2022-06-05 ENCOUNTER — Telehealth: Payer: Self-pay

## 2022-06-05 DIAGNOSIS — M25561 Pain in right knee: Secondary | ICD-10-CM | POA: Diagnosis not present

## 2022-06-05 DIAGNOSIS — M170 Bilateral primary osteoarthritis of knee: Secondary | ICD-10-CM

## 2022-06-05 DIAGNOSIS — M1712 Unilateral primary osteoarthritis, left knee: Secondary | ICD-10-CM | POA: Diagnosis not present

## 2022-06-05 DIAGNOSIS — M1711 Unilateral primary osteoarthritis, right knee: Secondary | ICD-10-CM

## 2022-06-05 DIAGNOSIS — M25562 Pain in left knee: Secondary | ICD-10-CM | POA: Diagnosis not present

## 2022-06-05 DIAGNOSIS — M545 Low back pain, unspecified: Secondary | ICD-10-CM | POA: Diagnosis not present

## 2022-06-05 MED ORDER — LIDOCAINE HCL 1 % IJ SOLN
5.0000 mL | INTRAMUSCULAR | Status: AC | PRN
  Administered 2022-06-05: 5 mL

## 2022-06-05 MED ORDER — BUPIVACAINE HCL 0.25 % IJ SOLN
4.0000 mL | INTRAMUSCULAR | Status: AC | PRN
  Administered 2022-06-05: 4 mL via INTRA_ARTICULAR

## 2022-06-05 MED ORDER — METHYLPREDNISOLONE ACETATE 40 MG/ML IJ SUSP
40.0000 mg | INTRAMUSCULAR | Status: AC | PRN
  Administered 2022-06-05: 40 mg via INTRA_ARTICULAR

## 2022-06-05 NOTE — Progress Notes (Signed)
Office Visit Note   Patient: Rebecca Cohen           Date of Birth: 29-Dec-1955           MRN: [id number] Visit Date: 06/05/2022 Requested by: Elfredia Nevins, MD [address] [place],  [place] [address] PCP: Elfredia Nevins, MD  Subjective: Chief Complaint  Patient presents with   Right Knee - Pain   Left Knee - Pain    HPI: Rebecca Cohen is a 66 y.o. female who presents to the office complaining of bilateral but primarily right knee pain.  Report pain is been ongoing for years but worse in the last year.  Localizes pain to the medial, lateral, posterior aspect of the knee.  Symptoms are worse with walking and standing.  Reports knee instability, mechanical clicking/popping, radiation to shin, radiation to hip, night pain, rest pain, and stiffness with immobility.  Denies any groin pain or low back pain with radicular symptoms.  Has tried treatments consisting of Aleve, injections, Norco, activity modification.  Patient reports history of No prior knee surgery.  They have walking endurance of 10 minutes.  They deny any history of diabetes, cardiac history, gout, blood thinner use, steroid use, previous DVT/PE.  She does have her first appointment with cardiologist next week for a checkup.  She smokes less than 1 pack/day.  They work as retired and enjoy housekeeping and gardening and hanging out with her granddaughters.   Additionally, patient complains of chronic low back pain.  She takes Norco as prescribed by her PCP.  She has history of lumbar compression fracture in 2017.  No history of L-spine surgery.  No radicular pain and just notices axial lumbar spine pain that radiates to the flanks at times.  This makes it so she can only stand for 10 to 15 minutes at a time until her back pain causes her to need to sit down.  She is never had any injections in her back.              ROS:  All systems reviewed are negative as they relate to the chief complaint within the history of  present illness.  Patient denies fevers or chills.  Assessment & Plan: Visit Diagnoses:  1. Pain in both knees, unspecified chronicity   2. Low back pain, unspecified back pain laterality, unspecified chronicity, unspecified whether sciatica present     Plan: Rebecca Cohen is a 66 y.o. female who presents to the office complaining of bilateral knee pain.  Patient has history of several years of knee pain that has been worse in the last year primarily.  No history of injury.  Right knee bothers her more.  She has arthritis demonstrated on knee radiographs today with significant progression primarily of the right knee arthritis compared with last radiographs from about a year and a half ago.  Primarily localized to the medial and patellofemoral compartments.  Previous injections have not given her very much relief but she has not tried gel injection.  Plan to try bilateral cortisone injections today.  Preapproved her for gel injections to try in the future.  She also has chronic low back pain with no improvement with exercise program or chronic opioid medication.  She would like to try lumbar spine ESI's.  Plan to order MRI of the lumbar spine for further evaluation of lumbar spine degenerative changes and possible stenosis.  Follow-up after MRI to review results and get set up for ESI's.  She does  have history of lumbar compression fracture in 2017.  This patient is diagnosed with osteoarthritis of the knee(s).    Radiographs show evidence of joint space narrowing, osteophytes, subchondral sclerosis and/or subchondral cysts.  This patient has knee pain which interferes with functional and activities of daily living.    This patient has experienced inadequate response, adverse effects and/or intolerance with conservative treatments such as acetaminophen, NSAIDS, topical creams, physical therapy or regular exercise, knee bracing and/or weight loss.   This patient has experienced inadequate response  or has a contraindication to intra articular steroid injections for at least 3 months.   This patient is not scheduled to have a total knee replacement within 6 months of starting treatment with viscosupplementation.   Follow-Up Instructions: No follow-ups on file.   Orders:  Orders Placed This Encounter  Procedures   XR KNEE 3 VIEW RIGHT   XR Knee 1-2 Views Left   MR Lumbar Spine w/o contrast   No orders of the defined types were placed in this encounter.     Procedures: Large Joint Inj: bilateral knee on 06/05/2022 1:17 PM Indications: diagnostic evaluation, joint swelling and pain Details: 18 G 1.5 in needle, superolateral approach  Arthrogram: No  Medications (Right): 5 mL lidocaine 1 %; 4 mL bupivacaine 0.25 %; 40 mg methylPREDNISolone acetate 40 MG/ML Medications (Left): 5 mL lidocaine 1 %; 4 mL bupivacaine 0.25 %; 40 mg methylPREDNISolone acetate 40 MG/ML Outcome: tolerated well, no immediate complications Procedure, treatment alternatives, risks and benefits explained, specific risks discussed. Consent was given by the patient. Immediately prior to procedure a time out was called to verify the correct patient, procedure, equipment, support staff and site/side marked as required. Patient was prepped and draped in the usual sterile fashion.       Clinical Data: No additional findings.  Objective: Vital Signs: There were no vitals taken for this visit.  Physical Exam:  Constitutional: Patient appears well-developed HEENT:  Head: Normocephalic Eyes:EOM are normal Neck: Normal range of motion Cardiovascular: Normal rate Pulmonary/chest: Effort normal Neurologic: Patient is alert Skin: Skin is warm Psychiatric: Patient has normal mood and affect  Ortho Exam:  Bilateral knee Exam Right knee with 10 degree flexion contracture.  Left knee with 5 degree flexion contracture.  Left knee flexes to 120 degrees.  Right knee flexes to 110 degrees. No effusion noted No  evidence of cellulitis or skin changes. Normal alignment Tender overlying the Medial joint line and Lateral joint line Extensor mechanism intact with preserved quad strength and no defect palpable in the patellar quadricep tendons Stable to anterior and posterior drawer signs.  Negative Lachman exam.  Stable to varus and valgus stress at 0 and 30 degrees.  Normal hip internal rotation with negative FADIR sign.  Negative Stinchfield sign.  Negative straight leg raise.  No calf tenderness.  Negative Homans' sign.  Palpable pedal pulses of the examined extremity with intact ankle dorsiflexion.  Tender to palpation over the axial lumbar spine.   Specialty Comments:  No specialty comments available.  Imaging: No results found.   PMFS History: Patient Active Problem List   Diagnosis Date Noted   Anemia due to other cause 09/23/2014   Leukocytosis 09/23/2014   Cellulitis of buttock, right 09/22/2014   Cellulitis of right buttock 09/22/2014   Family hx of colon cancer 02/27/2011   Other constipation 02/27/2011   Past Medical History:  Diagnosis Date   COPD (chronic obstructive pulmonary disease) ([facility])    Depression    Fluttering  heart    GERD (gastroesophageal reflux disease)    pt sts sfter egd with ed and diet change is on no meds and has no more problems with this   Hyperlipidemia    Leg pain    Osteopenia    previously on Fosamax    Family History  Problem Relation Age of Onset   Colon cancer Brother 37       deceased   Diabetes Father    Lupus Sister    Anesthesia problems Neg Hx    Hypotension Neg Hx    Malignant hyperthermia Neg Hx    Pseudochol deficiency Neg Hx     Past Surgical History:  Procedure Laterality Date   ABDOMINAL HYSTERECTOMY     1990's   CERVICAL DISC SURGERY     COLONOSCOPY  03/14/2011   Procedure: COLONOSCOPY;  Surgeon: Corbin Ade, MD;  Location: [facility];  Service: Gastroenterology;  Laterality: N/A;  In the cecum at 1332.  Total withdrawal  time 8    minutes.   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

## 2022-06-05 NOTE — Telephone Encounter (Signed)
Auth needed for bilat knee gel injection  

## 2022-06-07 DIAGNOSIS — M5416 Radiculopathy, lumbar region: Secondary | ICD-10-CM | POA: Diagnosis not present

## 2022-06-07 DIAGNOSIS — M5126 Other intervertebral disc displacement, lumbar region: Secondary | ICD-10-CM | POA: Diagnosis not present

## 2022-06-07 DIAGNOSIS — G894 Chronic pain syndrome: Secondary | ICD-10-CM | POA: Diagnosis not present

## 2022-06-07 DIAGNOSIS — M810 Age-related osteoporosis without current pathological fracture: Secondary | ICD-10-CM | POA: Diagnosis not present

## 2022-06-07 NOTE — Telephone Encounter (Signed)
VOB submitted for Monovisc, bilateral knee  

## 2022-06-10 NOTE — Progress Notes (Deleted)
CARDIOLOGY CONSULT NOTE       Patient ID: Rebecca Cohen MRN: [id number] DOB/AGE: January 27, 1956 66 y.o.  Admit date: (Not on file) Referring Physician: Sherwood Gambler Primary Physician: Elfredia Nevins, MD Primary Cardiologist: New Reason for Consultation: Palpitations  Active Problems:   * No active hospital problems. *   HPI:  66 y.o. referred by Dr Sherwood Gambler for palpitations. History of smoking , COPD GERD, Depression and HLD. 20 pack year history of smoking 1/2 PPD During primary office visit 05/21/22 complained of heart fluttering   ***  ROS All other systems reviewed and negative except as noted above  Past Medical History:  Diagnosis Date   COPD (chronic obstructive pulmonary disease) (HCC)    Depression    Fluttering heart    GERD (gastroesophageal reflux disease)    pt sts sfter egd with ed and diet change is on no meds and has no more problems with this   Hyperlipidemia    Leg pain    Osteopenia    previously on Fosamax    Family History  Problem Relation Age of Onset   Colon cancer Brother 49       deceased   Diabetes Father    Lupus Sister    Anesthesia problems Neg Hx    Hypotension Neg Hx    Malignant hyperthermia Neg Hx    Pseudochol deficiency Neg Hx     Social History   Socioeconomic History   Marital status: Married    Spouse name: Not on file   Number of children: 3   Years of education: Not on file   Highest education level: Not on file  Occupational History   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  Tobacco Use   Smoking status: Every Day    Packs/day: 0.50    Years: 20.00    Total pack years: 10.00    Types: Cigarettes   Smokeless tobacco: Never   Tobacco comments:    1/2 pack a day  Substance and Sexual Activity   Alcohol use: No   Drug use: No   Sexual activity: Yes    Birth control/protection: Surgical  Other Topics Concern   Not on file  Social History Narrative   Not on file   Social Determinants of  Health   Financial Resource Strain: Not on file  Food Insecurity: Not on file  Transportation Needs: Not on file  Physical Activity: Not on file  Stress: Not on file  Social Connections: Not on file  Intimate Partner Violence: Not on file    Past Surgical History:  Procedure Laterality Date   ABDOMINAL HYSTERECTOMY     1990's   CERVICAL DISC SURGERY     COLONOSCOPY  03/14/2011   Procedure: COLONOSCOPY;  Surgeon: Corbin Ade, MD;  Location: [facility];  Service: Gastroenterology;  Laterality: N/A;  In the cecum at 1332.  Total withdrawal time 8    minutes.   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   s/p hysterectomy        Current Outpatient Medications:    acyclovir (ZOVIRAX) 400 MG tablet, Take 400 mg by mouth 5 (five) times daily. When flare up occurs, Disp: , Rfl:    alendronate (FOSAMAX) 70 MG tablet, Take by mouth., Disp: , Rfl:    aspirin 81 MG chewable tablet, Chew 81 mg by mouth daily., Disp: , Rfl:    calcium carbonate (OSCAL) 1500 (600 Ca) MG TABS tablet,  Take 600 mg of elemental calcium by mouth 2 (two) times daily with a meal., Disp: , Rfl:    cholecalciferol (VITAMIN D3) 25 MCG (1000 UNIT) tablet, Take 1,000 Units by mouth daily., Disp: , Rfl:    diphenhydrAMINE (BENADRYL) 25 MG tablet, Take 25 mg by mouth daily as needed for allergies. , Disp: , Rfl:    HYDROcodone-acetaminophen (NORCO) 7.5-325 MG tablet, Take 1 tablet by mouth every 4 (four) hours., Disp: , Rfl:    ibuprofen (ADVIL) 200 MG tablet, Take 200 mg by mouth every 6 (six) hours as needed., Disp: , Rfl:    LORazepam (ATIVAN) 2 MG tablet, Take 2 mg by mouth daily., Disp: , Rfl:    mirtazapine (REMERON) 30 MG tablet, Take 30 mg by mouth at bedtime., Disp: , Rfl:    Multiple Vitamin (MULTIVITAMIN) tablet, Take 1 tablet by mouth daily., Disp: , Rfl:    Quercetin 250 MG TABS, Take 1 tablet by mouth., Disp: , Rfl:    zinc gluconate 50 MG tablet, Take 50 mg by  mouth daily., Disp: , Rfl:     Physical Exam: There were no vitals taken for this visit.    Affect appropriate Healthy:  appears stated age 51: normal Neck supple with no adenopathy JVP normal no bruits no thyromegaly Lungs clear with no wheezing and good diaphragmatic motion Heart:  S1/S2 no murmur, no rub, gallop or click PMI normal Abdomen: benighn, BS positve, no tenderness, no AAA no bruit.  No HSM or HJR Distal pulses intact with no bruits No edema Neuro non-focal Skin warm and dry No muscular weakness   Labs:   Lab Results  Component Value Date   WBC 13.6 (H) 05/15/2020   HGB 13.6 05/15/2020   HCT 41.7 05/15/2020   MCV 95.0 05/15/2020   PLT 289 05/15/2020   No results for input(s): "NA", "K", "CL", "CO2", "BUN", "CREATININE", "CALCIUM", "PROT", "BILITOT", "ALKPHOS", "ALT", "AST", "GLUCOSE" in the last 168 hours.  Invalid input(s): "LABALBU" Lab Results  Component Value Date   TROPONINI <0.03 09/29/2015   No results found for: "CHOL" No results found for: "HDL" No results found for: "[place]" No results found for: "TRIG" No results found for: "CHOLHDL" No results found for: "LDLDIRECT"    Radiology: XR Knee 1-2 Views Left  Result Date: 06/05/2022 [facility], lateral, sunrise views of left knee reviewed.  Moderate degenerative changes of the medial and patellofemoral compartments noted.  No fracture or dislocation noted.  No abnormal patellar height.  XR KNEE 3 VIEW RIGHT  Result Date: 06/05/2022 AP, lateral, sunrise views of right knee reviewed.  Moderate to severe degenerative changes noted primarily the medial and patellofemoral compartments with mild varus alignment.  No fracture or dislocation noted.  No abnormal patellar height.   EKG: ***   ASSESSMENT AND PLAN:   Palpitations: *** Smoking: counseled on cessation < 10 minutes Lung cancer CT ordered  Osteoporosis:  continue Fosamax f/u primary Arthritis: bilateral knees post steroid injection f/u  ortho   30 day monitor TTE  F/U PRN pending test results   Signed: Jenkins Rouge 06/10/2022, 1:11 PM

## 2022-06-13 ENCOUNTER — Ambulatory Visit: Payer: 59 | Admitting: Cardiovascular Disease

## 2022-06-17 IMAGING — DX DG THORACIC SPINE 3V
3 series · 3 of 3 positions shown · non-contrast
Comparison: Chest x-ray December 28, 2018.

CLINICAL DATA: Pain in the thoracic spine. Pain in mid to low back
for a year. No known injury.

EXAM:
THORACIC SPINE - 3 VIEWS

[t-spine ap]
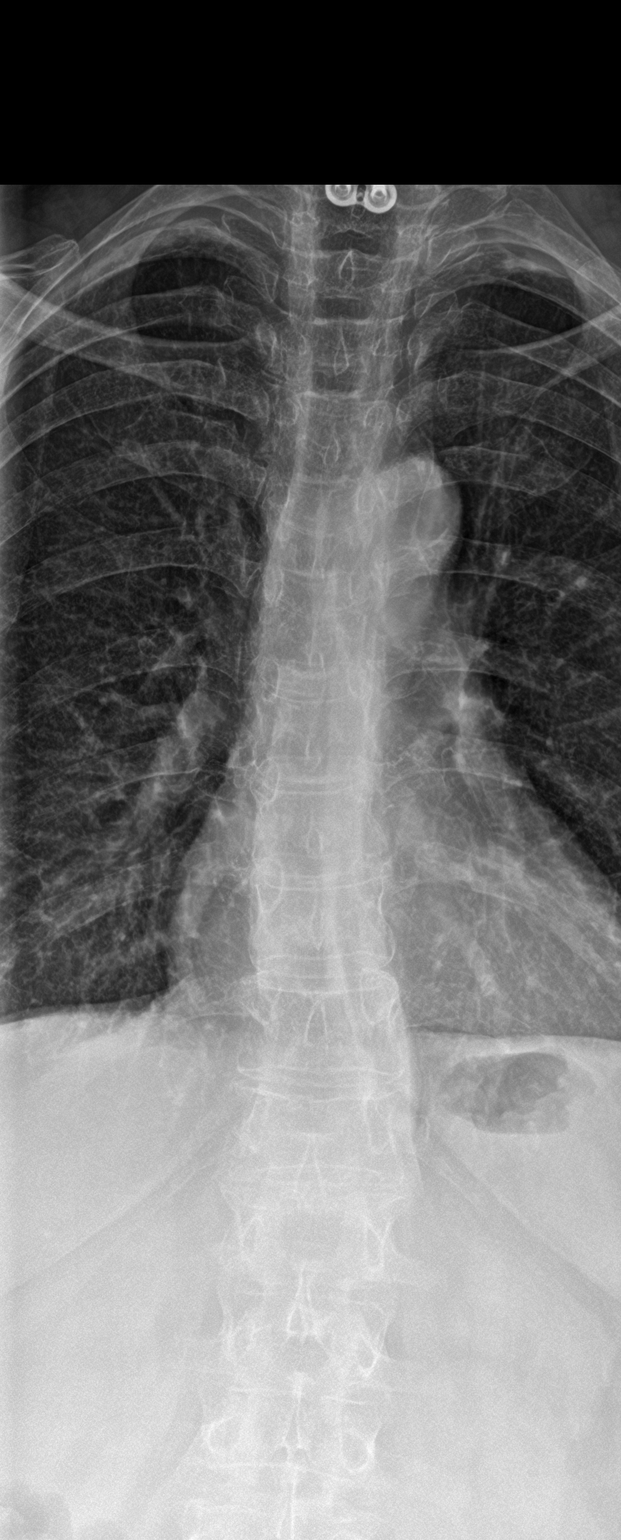

[t-spine lat]
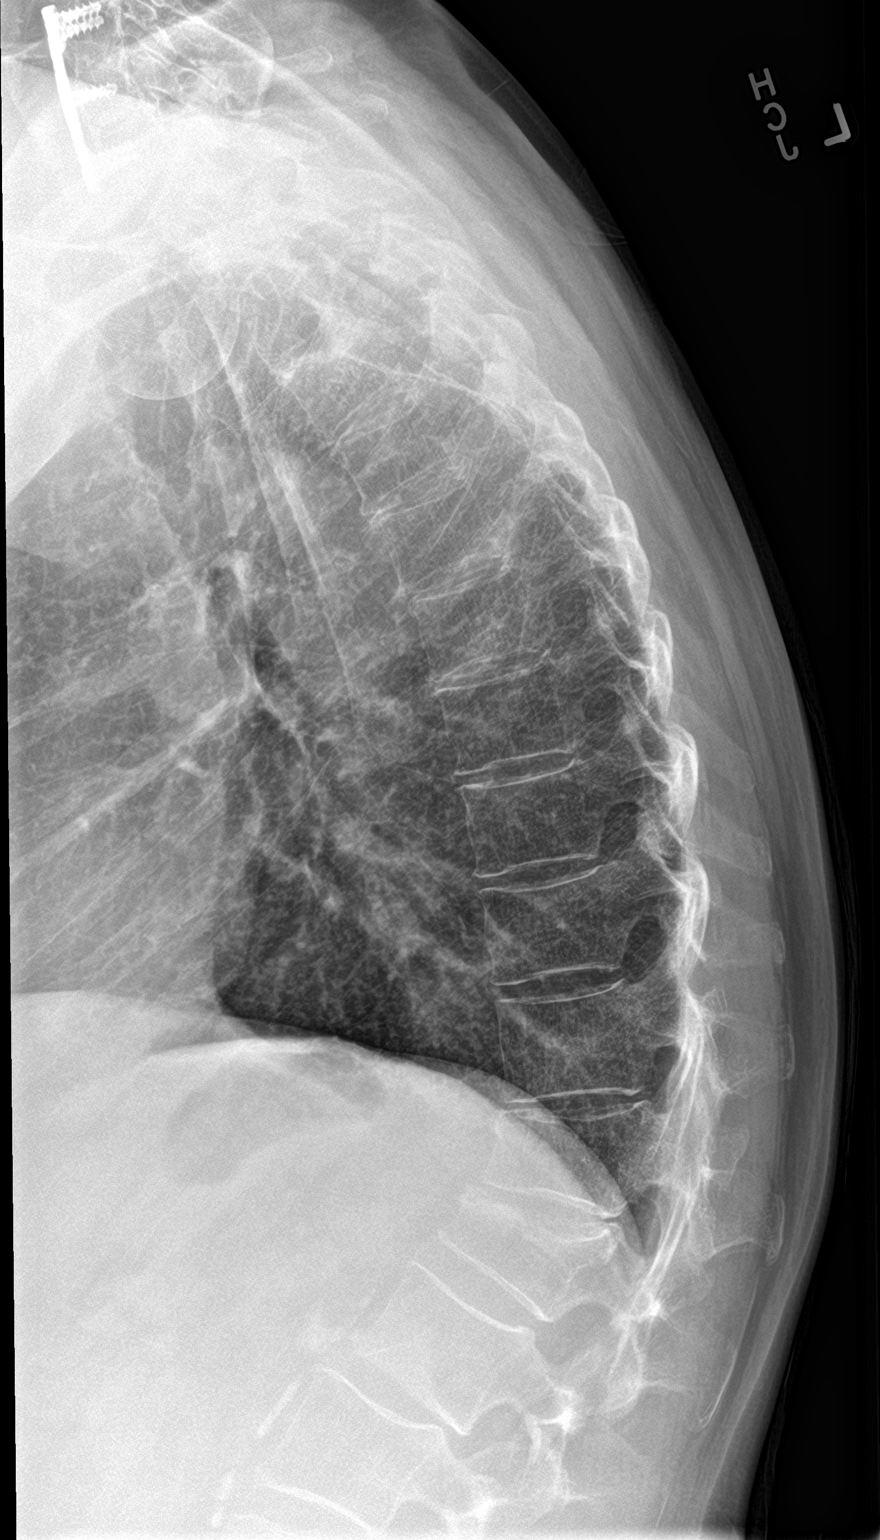

[t-spine swimmers]
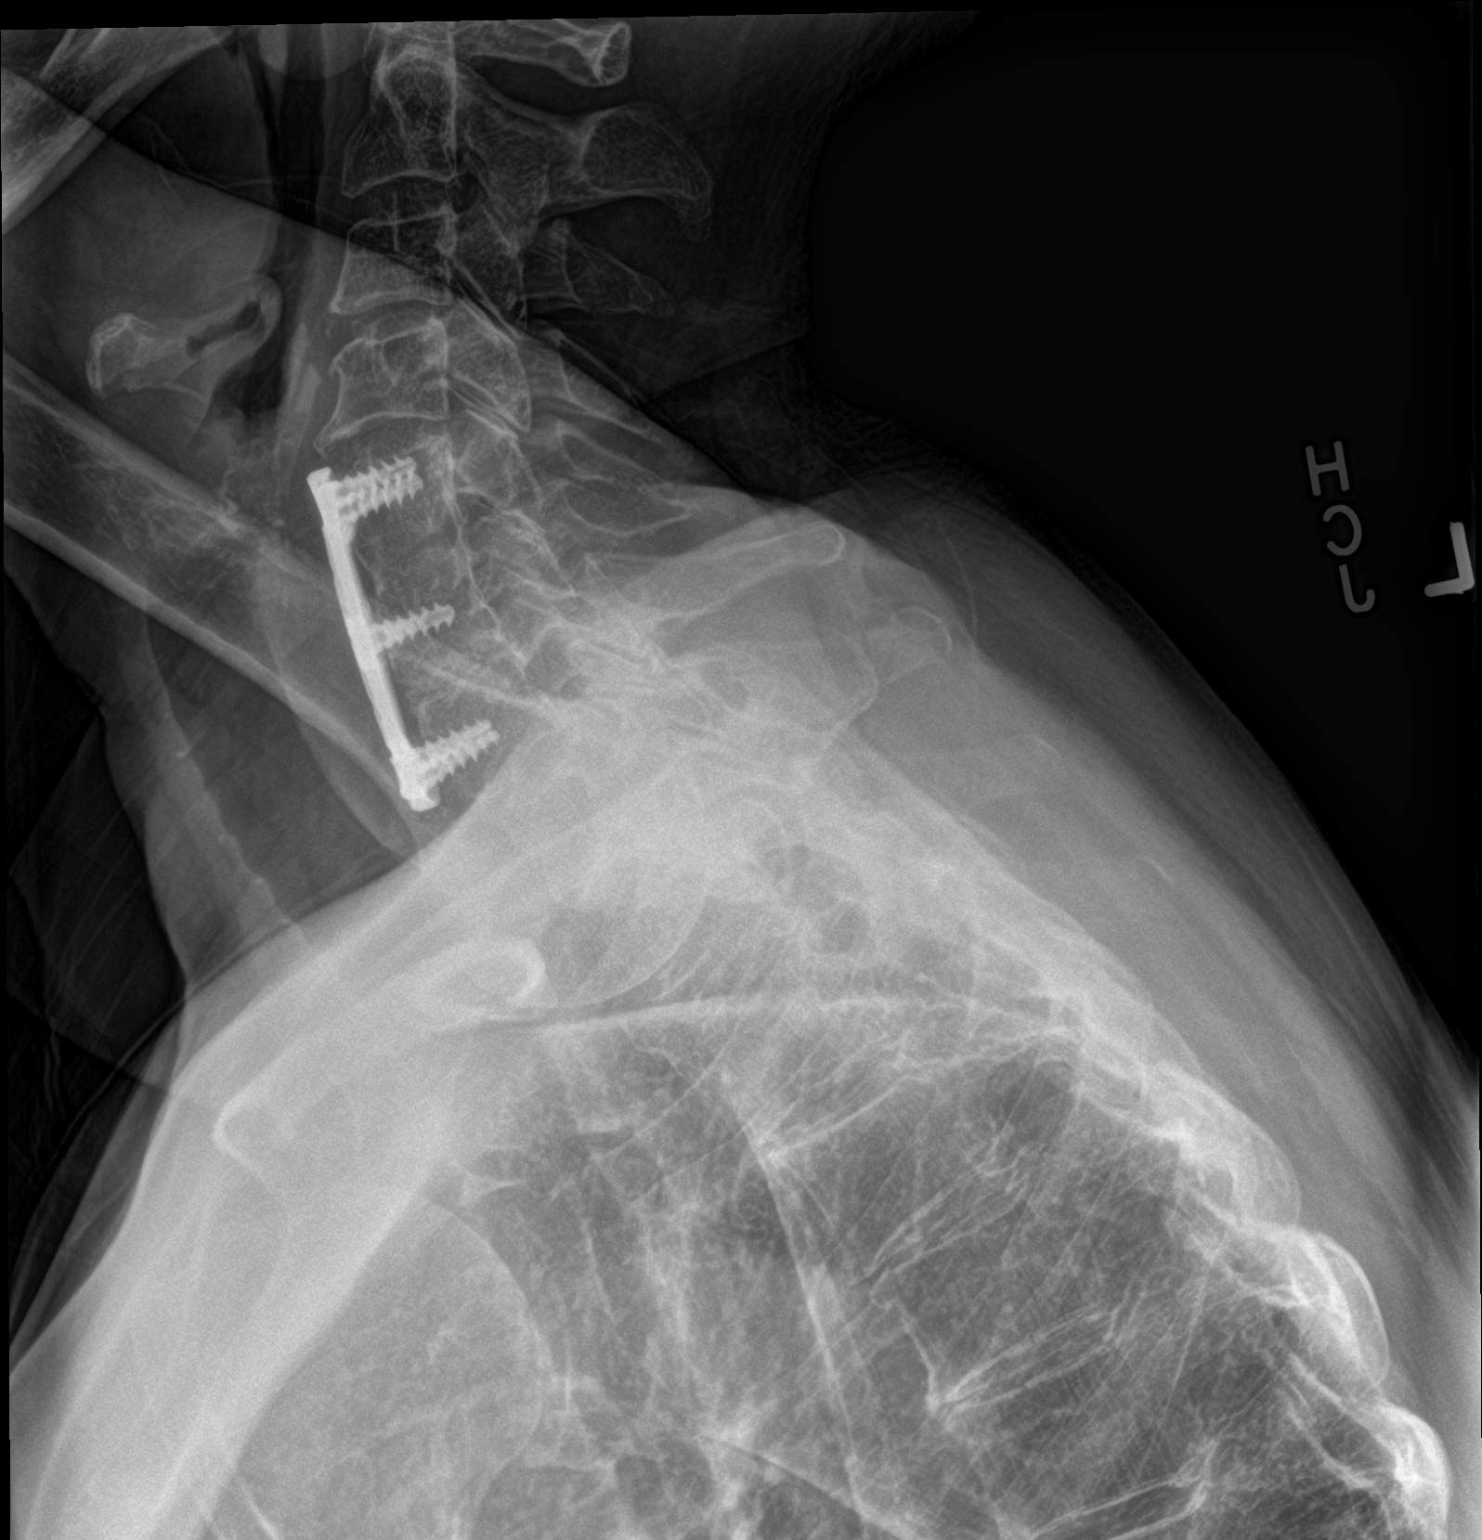

[3 of 3 positions shown; findings below may reference images not displayed]

FINDINGS: Anterior plate and screws are seen from C5 through C7. Hardware is
in good position based on provided imaging.

Mild scoliotic curvature of the thoracolumbar spine to the left.
Focal kyphosis centered at T12-L1. Anterior wedging of T12 is
similar in appearance since April 2016 and December 28, 2018. There
is anterior wedging with at least 50% loss of anterior height at L1
which is new compared to December 28, 2018. No other fractures are
identified. No other malalignment.
IMPRESSION: 1. There is a compression fracture of L1 which was not present in
Tuesday December, 2018. This finding is otherwise age indeterminate. The
patient's symptoms are described as pain for a year and the finding
may not be acute. Recommend clinical correlation.
2. Mild chronic compression fracture of T12, present since at least
3. No other acute abnormalities.

## 2022-07-01 NOTE — Progress Notes (Deleted)
CARDIOLOGY CONSULT NOTE       Patient ID: Rebecca Cohen MRN: [id number] DOB/AGE: [birth year] 66 y.o.  Admit date: (Not on file) Referring Physician: Sherwood Gambler Primary Physician: Elfredia Nevins, MD Primary Cardiologist: New Reason for Consultation: Palpitations   Active Problems:   * No active hospital problems. *   HPI:  66 y.o. referred by [facility] Dr Sherwood Gambler for palpitations Reviewed office note from 05/21/22 Complained of heart fluttering at times in afternoon. Smokes 1/2 ppd On oxycodone for right knee arthritis Chronic right leg pain Had normal ABI's in 2011 and 2015  ***  ROS All other systems reviewed and negative except as noted above  Past Medical History:  Diagnosis Date   COPD (chronic obstructive pulmonary disease) (HCC)    Depression    Fluttering heart    GERD (gastroesophageal reflux disease)    pt sts sfter egd with ed and diet change is on no meds and has no more problems with this   Hyperlipidemia    Leg pain    Osteopenia    previously on Fosamax    Family History  Problem Relation Age of Onset   Colon cancer Brother 22       deceased   Diabetes Father    Lupus Sister    Anesthesia problems Neg Hx    Hypotension Neg Hx    Malignant hyperthermia Neg Hx    Pseudochol deficiency Neg Hx     Social History   Socioeconomic History   Marital status: Married    Spouse name: Not on file   Number of children: 3   Years of education: Not on file   Highest education level: Not on file  Occupational History   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  Tobacco Use   Smoking status: Every Day    Packs/day: 0.50    Years: 20.00    Total pack years: 10.00    Types: Cigarettes   Smokeless tobacco: Never   Tobacco comments:    1/2 pack a day  Substance and Sexual Activity   Alcohol use: No   Drug use: No   Sexual activity: Yes    Birth control/protection: Surgical  Other Topics Concern   Not on file  Social History  Narrative   Not on file   Social Determinants of Health   Financial Resource Strain: Not on file  Food Insecurity: Not on file  Transportation Needs: Not on file  Physical Activity: Not on file  Stress: Not on file  Social Connections: Not on file  Intimate Partner Violence: Not on file    Past Surgical History:  Procedure Laterality Date   ABDOMINAL HYSTERECTOMY     1990's   CERVICAL DISC SURGERY     COLONOSCOPY  03/14/2011   Procedure: COLONOSCOPY;  Surgeon: Corbin Ade, MD;  Location: [facility];  Service: Gastroenterology;  Laterality: N/A;  In the cecum at 1332.  Total withdrawal time 8    minutes.   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   s/p hysterectomy        Current Outpatient Medications:    acyclovir (ZOVIRAX) 400 MG tablet, Take 400 mg by mouth 5 (five) times daily. When flare up occurs, Disp: , Rfl:    alendronate (FOSAMAX) 70 MG tablet, Take by mouth., Disp: , Rfl:    aspirin 81 MG chewable tablet, Chew 81 mg by mouth daily., Disp: , Rfl:  calcium carbonate (OSCAL) 1500 (600 Ca) MG TABS tablet, Take 600 mg of elemental calcium by mouth 2 (two) times daily with a meal., Disp: , Rfl:    cholecalciferol (VITAMIN D3) 25 MCG (1000 UNIT) tablet, Take 1,000 Units by mouth daily., Disp: , Rfl:    diphenhydrAMINE (BENADRYL) 25 MG tablet, Take 25 mg by mouth daily as needed for allergies. , Disp: , Rfl:    HYDROcodone-acetaminophen (NORCO) 7.5-325 MG tablet, Take 1 tablet by mouth every 4 (four) hours., Disp: , Rfl:    ibuprofen (ADVIL) 200 MG tablet, Take 200 mg by mouth every 6 (six) hours as needed., Disp: , Rfl:    LORazepam (ATIVAN) 2 MG tablet, Take 2 mg by mouth daily., Disp: , Rfl:    mirtazapine (REMERON) 30 MG tablet, Take 30 mg by mouth at bedtime., Disp: , Rfl:    Multiple Vitamin (MULTIVITAMIN) tablet, Take 1 tablet by mouth daily., Disp: , Rfl:    Quercetin 250 MG TABS, Take 1 tablet by mouth., Disp: ,  Rfl:    zinc gluconate 50 MG tablet, Take 50 mg by mouth daily., Disp: , Rfl:     Physical Exam: There were no vitals taken for this visit.    Affect appropriate Healthy:  appears stated age HEENT: normal Neck supple with no adenopathy JVP normal no bruits no thyromegaly Lungs clear with no wheezing and good diaphragmatic motion Heart:  S1/S2 no murmur, no rub, gallop or click PMI normal Abdomen: benighn, BS positve, no tenderness, no AAA no bruit.  No HSM or HJR Distal pulses intact with no bruits No edema Neuro non-focal Skin warm and dry No muscular weakness   Labs:   Lab Results  Component Value Date   WBC 13.6 (H) 05/15/2020   HGB 13.6 05/15/2020   HCT 41.7 05/15/2020   MCV 95.0 05/15/2020   PLT 289 05/15/2020   No results for input(s): "NA", "K", "CL", "CO2", "BUN", "CREATININE", "CALCIUM", "PROT", "BILITOT", "ALKPHOS", "ALT", "AST", "GLUCOSE" in the last 168 hours.  Invalid input(s): "LABALBU" Lab Results  Component Value Date   TROPONINI <0.03 09/29/2015   No results found for: "CHOL" No results found for: "HDL" No results found for: "LDLCALC" No results found for: "TRIG" No results found for: "CHOLHDL" No results found for: "LDLDIRECT"    Radiology: XR Knee 1-2 Views Left  Result Date: 06/05/2022 AP, lateral, sunrise views of left knee reviewed.  Moderate degenerative changes of the medial and patellofemoral compartments noted.  No fracture or dislocation noted.  No abnormal patellar height.  XR KNEE 3 VIEW RIGHT  Result Date: 06/05/2022 AP, lateral, sunrise views of right knee reviewed.  Moderate to severe degenerative changes noted primarily the medial and patellofemoral compartments with mild varus alignment.  No fracture or dislocation noted.  No abnormal patellar height.   EKG: 2017  SR rate 91 non specific inferior lateral ST changes    ASSESSMENT AND PLAN:   Palpitations: *** Smoking:  counseled on cessation < 10 minutes Lung cancer CT  ordered Arthritis:  chronic pain right knee Oxycodone ***    Lung cancer CT TTE/ 30 day monitor  TSH/T4  F/U PRN pending results  Signed: Charlton Haws 07/01/2022, 11:18 AM

## 2022-07-08 ENCOUNTER — Telehealth: Payer: Self-pay

## 2022-07-08 NOTE — Telephone Encounter (Signed)
Talked with patient and appt.has been scheduled for gel injection.  PA submitted to [facility] for Monovisc, bilateral knee at [id number].

## 2022-07-09 ENCOUNTER — Other Ambulatory Visit: Payer: Self-pay

## 2022-07-09 ENCOUNTER — Ambulatory Visit: Payer: 59 | Attending: Cardiovascular Disease | Admitting: Cardiovascular Disease

## 2022-07-09 DIAGNOSIS — M810 Age-related osteoporosis without current pathological fracture: Secondary | ICD-10-CM | POA: Diagnosis not present

## 2022-07-09 DIAGNOSIS — M5416 Radiculopathy, lumbar region: Secondary | ICD-10-CM | POA: Diagnosis not present

## 2022-07-09 DIAGNOSIS — M170 Bilateral primary osteoarthritis of knee: Secondary | ICD-10-CM

## 2022-07-09 DIAGNOSIS — G894 Chronic pain syndrome: Secondary | ICD-10-CM | POA: Diagnosis not present

## 2022-07-19 ENCOUNTER — Ambulatory Visit: Payer: 59 | Admitting: Surgical

## 2022-07-19 DIAGNOSIS — M1711 Unilateral primary osteoarthritis, right knee: Secondary | ICD-10-CM | POA: Diagnosis not present

## 2022-07-19 DIAGNOSIS — M1712 Unilateral primary osteoarthritis, left knee: Secondary | ICD-10-CM

## 2022-07-19 DIAGNOSIS — M170 Bilateral primary osteoarthritis of knee: Secondary | ICD-10-CM | POA: Diagnosis not present

## 2022-07-20 ENCOUNTER — Encounter: Payer: Self-pay | Admitting: Surgical

## 2022-07-20 MED ORDER — LIDOCAINE HCL 1 % IJ SOLN
5.0000 mL | INTRAMUSCULAR | Status: AC | PRN
  Administered 2022-07-19: 5 mL

## 2022-07-20 MED ORDER — HYALURONAN 88 MG/4ML IX SOSY
88.0000 mg | PREFILLED_SYRINGE | INTRA_ARTICULAR | Status: AC | PRN
  Administered 2022-07-19: 88 mg via INTRA_ARTICULAR

## 2022-07-20 NOTE — Progress Notes (Signed)
   Procedure Note  Patient: Rebecca Cohen             Date of Birth: 07/18/1956           MRN: [id number]             Visit Date: 07/19/2022  Procedures: Visit Diagnoses: No diagnosis found.  Large Joint Inj: bilateral knee on 07/19/2022 1:24 PM Indications: diagnostic evaluation, joint swelling and pain Details: 18 G 1.5 in needle, superolateral approach  Arthrogram: No  Medications (Right): 5 mL lidocaine 1 %; 88 mg Hyaluronan 88 MG/4ML Medications (Left): 5 mL lidocaine 1 %; 88 mg Hyaluronan 88 MG/4ML Outcome: tolerated well, no immediate complications Procedure, treatment alternatives, risks and benefits explained, specific risks discussed. Consent was given by the patient. Immediately prior to procedure a time out was called to verify the correct patient, procedure, equipment, support staff and site/side marked as required. Patient was prepped and draped in the usual sterile fashion.

## 2022-08-05 DIAGNOSIS — M810 Age-related osteoporosis without current pathological fracture: Secondary | ICD-10-CM | POA: Diagnosis not present

## 2022-08-05 DIAGNOSIS — M5416 Radiculopathy, lumbar region: Secondary | ICD-10-CM | POA: Diagnosis not present

## 2022-08-05 DIAGNOSIS — Z6824 Body mass index (BMI) 24.0-24.9, adult: Secondary | ICD-10-CM | POA: Diagnosis not present

## 2022-08-05 DIAGNOSIS — M5126 Other intervertebral disc displacement, lumbar region: Secondary | ICD-10-CM | POA: Diagnosis not present

## 2022-08-05 DIAGNOSIS — G894 Chronic pain syndrome: Secondary | ICD-10-CM | POA: Diagnosis not present

## 2022-08-05 DIAGNOSIS — R69 Illness, unspecified: Secondary | ICD-10-CM | POA: Diagnosis not present

## 2022-08-05 DIAGNOSIS — M1991 Primary osteoarthritis, unspecified site: Secondary | ICD-10-CM | POA: Diagnosis not present

## 2022-09-02 DIAGNOSIS — M5126 Other intervertebral disc displacement, lumbar region: Secondary | ICD-10-CM | POA: Diagnosis not present

## 2022-09-02 DIAGNOSIS — M1991 Primary osteoarthritis, unspecified site: Secondary | ICD-10-CM | POA: Diagnosis not present

## 2022-09-02 DIAGNOSIS — G894 Chronic pain syndrome: Secondary | ICD-10-CM | POA: Diagnosis not present

## 2022-10-02 DIAGNOSIS — U071 COVID-19: Secondary | ICD-10-CM | POA: Diagnosis not present

## 2022-10-02 DIAGNOSIS — M1991 Primary osteoarthritis, unspecified site: Secondary | ICD-10-CM | POA: Diagnosis not present

## 2022-10-02 DIAGNOSIS — G894 Chronic pain syndrome: Secondary | ICD-10-CM | POA: Diagnosis not present

## 2022-10-02 DIAGNOSIS — R69 Illness, unspecified: Secondary | ICD-10-CM | POA: Diagnosis not present

## 2022-10-17 ENCOUNTER — Encounter: Payer: Self-pay | Admitting: Radiology

## 2022-11-07 DIAGNOSIS — J9801 Acute bronchospasm: Secondary | ICD-10-CM | POA: Diagnosis not present

## 2022-11-07 DIAGNOSIS — M5416 Radiculopathy, lumbar region: Secondary | ICD-10-CM | POA: Diagnosis not present

## 2022-11-07 DIAGNOSIS — M5126 Other intervertebral disc displacement, lumbar region: Secondary | ICD-10-CM | POA: Diagnosis not present

## 2022-11-07 DIAGNOSIS — F1120 Opioid dependence, uncomplicated: Secondary | ICD-10-CM | POA: Diagnosis not present

## 2022-11-07 DIAGNOSIS — G894 Chronic pain syndrome: Secondary | ICD-10-CM | POA: Diagnosis not present

## 2022-11-07 DIAGNOSIS — M810 Age-related osteoporosis without current pathological fracture: Secondary | ICD-10-CM | POA: Diagnosis not present

## 2022-11-07 DIAGNOSIS — J329 Chronic sinusitis, unspecified: Secondary | ICD-10-CM | POA: Diagnosis not present

## 2022-11-07 DIAGNOSIS — Z6824 Body mass index (BMI) 24.0-24.9, adult: Secondary | ICD-10-CM | POA: Diagnosis not present

## 2022-11-07 DIAGNOSIS — M1991 Primary osteoarthritis, unspecified site: Secondary | ICD-10-CM | POA: Diagnosis not present

## 2022-12-13 DIAGNOSIS — F1120 Opioid dependence, uncomplicated: Secondary | ICD-10-CM | POA: Diagnosis not present

## 2022-12-13 DIAGNOSIS — G894 Chronic pain syndrome: Secondary | ICD-10-CM | POA: Diagnosis not present

## 2022-12-13 DIAGNOSIS — M5126 Other intervertebral disc displacement, lumbar region: Secondary | ICD-10-CM | POA: Diagnosis not present

## 2022-12-13 DIAGNOSIS — M1991 Primary osteoarthritis, unspecified site: Secondary | ICD-10-CM | POA: Diagnosis not present

## 2023-01-03 ENCOUNTER — Ambulatory Visit: Payer: 59 | Admitting: Surgical

## 2023-01-14 DIAGNOSIS — M1991 Primary osteoarthritis, unspecified site: Secondary | ICD-10-CM | POA: Diagnosis not present

## 2023-01-14 DIAGNOSIS — G894 Chronic pain syndrome: Secondary | ICD-10-CM | POA: Diagnosis not present

## 2023-01-14 DIAGNOSIS — M5126 Other intervertebral disc displacement, lumbar region: Secondary | ICD-10-CM | POA: Diagnosis not present

## 2023-01-14 DIAGNOSIS — M180 Bilateral primary osteoarthritis of first carpometacarpal joints: Secondary | ICD-10-CM | POA: Diagnosis not present

## 2023-01-16 ENCOUNTER — Other Ambulatory Visit: Payer: Self-pay | Admitting: Internal Medicine

## 2023-01-16 DIAGNOSIS — Z1231 Encounter for screening mammogram for malignant neoplasm of breast: Secondary | ICD-10-CM

## 2023-01-17 ENCOUNTER — Ambulatory Visit
Admission: RE | Admit: 2023-01-17 | Discharge: 2023-01-17 | Disposition: A | Discharge status: Home / Self Care | Payer: 59 | Source: Ambulatory Visit | Attending: Internal Medicine | Admitting: Internal Medicine

## 2023-01-17 DIAGNOSIS — Z1231 Encounter for screening mammogram for malignant neoplasm of breast: Secondary | ICD-10-CM | POA: Diagnosis not present

## 2023-01-24 ENCOUNTER — Ambulatory Visit: Payer: 59 | Admitting: Surgical

## 2023-01-29 ENCOUNTER — Ambulatory Visit: Payer: 59 | Admitting: Surgical

## 2023-02-12 ENCOUNTER — Other Ambulatory Visit (INDEPENDENT_AMBULATORY_CARE_PROVIDER_SITE_OTHER): Payer: 59

## 2023-02-12 ENCOUNTER — Ambulatory Visit: Payer: 59 | Admitting: Surgical

## 2023-02-12 ENCOUNTER — Telehealth: Payer: Self-pay

## 2023-02-12 DIAGNOSIS — M545 Low back pain, unspecified: Secondary | ICD-10-CM

## 2023-02-12 DIAGNOSIS — M170 Bilateral primary osteoarthritis of knee: Secondary | ICD-10-CM

## 2023-02-12 NOTE — Telephone Encounter (Signed)
Submitted for [facility] for Monovisc- bilateral knee  

## 2023-02-12 NOTE — Telephone Encounter (Signed)
Can we get this patient set up for Bil gel injections

## 2023-02-13 ENCOUNTER — Encounter: Payer: Self-pay | Admitting: Surgical

## 2023-02-13 MED ORDER — LIDOCAINE HCL 1 % IJ SOLN
5.0000 mL | INTRAMUSCULAR | Status: AC | PRN
Start: 2023-02-12 — End: 2023-02-12
  Administered 2023-02-12: 5 mL

## 2023-02-13 MED ORDER — METHYLPREDNISOLONE ACETATE 40 MG/ML IJ SUSP
40.0000 mg | INTRAMUSCULAR | Status: AC | PRN
Start: 2023-02-12 — End: 2023-02-12
  Administered 2023-02-12: 40 mg via INTRA_ARTICULAR

## 2023-02-13 MED ORDER — BUPIVACAINE HCL 0.25 % IJ SOLN
4.0000 mL | INTRAMUSCULAR | Status: AC | PRN
Start: 2023-02-12 — End: 2023-02-12
  Administered 2023-02-12: 4 mL via INTRA_ARTICULAR

## 2023-02-13 NOTE — Telephone Encounter (Signed)
PA required.

## 2023-02-13 NOTE — Progress Notes (Signed)
Office Visit Note   Patient: Rebecca Cohen           Date of Birth: 06/02/1956           MRN: [id number] Visit Date: 02/12/2023 Requested by: Elfredia Nevins, MD [address] [place],  [place] [address] PCP: Elfredia Nevins, MD  Subjective: Chief Complaint  Patient presents with   Right Knee - Pain   Left Knee - Pain    HPI: MADELEIN MAHADEO is a 67 y.o. female who presents to the office reporting bilateral knee pain and low back pain.  Patient states that her knee pain has returned.  Prior gel injections in December 2023 gave her great relief.  She feels the gel injection is more helpful than the cortisone injection.  No new injury to either knee.  She would like to repeat cortisone injections today.  No groin pain.  Does have some low back pain without radicular pain.  Has no history of prior low back surgery but she does have history of prior L1 compression fracture.  She has about 10 minutes standing endurance due to the low back pain she has.  If she is walking, her endurance is higher.  Mostly localizes pain to the mid to low back that she describes as a pressure.  Cannot lay flat on her back.  No red flag signs or symptoms such as bowel/bladder incontinence or saddle anesthesia..                ROS: All systems reviewed are negative as they relate to the chief complaint within the history of present illness.  Patient denies fevers or chills.  Assessment & Plan: Visit Diagnoses:  1. Low back pain, unspecified back pain laterality, unspecified chronicity, unspecified whether sciatica present     Plan: Patient is a 67 year old female who presents for evaluation of bilateral knee pain and low back pain.  She has history of longstanding low back pain for which we previously evaluated her for in late 2023.  We ordered MRI of the lumbar spine for further evaluation of sources of low back pain aside from the chronic L1 compression fracture that she has had for several years.  This  was denied but with continued symptoms greater than 6 months in duration without any improvement with conservative management and home exercise program, need MRI for further evaluation of her chronic low back pain.  We also administered bilateral knee cortisone injections today and she tolerated these injections well without any difficulty.  We will preapproved her for bilateral knee gel injections to administer once the cortisone injections wear off.  Gel injection has been more helpful for her than the cortisone injection.  This patient is diagnosed with osteoarthritis of the knee(s).    Radiographs show evidence of joint space narrowing, osteophytes, subchondral sclerosis and/or subchondral cysts.  This patient has knee pain which interferes with functional and activities of daily living.    This patient has experienced inadequate response, adverse effects and/or intolerance with conservative treatments such as acetaminophen, NSAIDS, topical creams, physical therapy or regular exercise, knee bracing and/or weight loss.   This patient has experienced inadequate response or has a contraindication to intra articular steroid injections for at least 3 months.   This patient is not scheduled to have a total knee replacement within 6 months of starting treatment with viscosupplementation.   Follow-Up Instructions: No follow-ups on file.   Orders:  Orders Placed This Encounter  Procedures   XR Lumbar  Spine 2-3 Views   MR Lumbar Spine w/o contrast   No orders of the defined types were placed in this encounter.     Procedures: Large Joint Inj: bilateral knee on 02/12/2023 10:18 AM Indications: diagnostic evaluation, joint swelling and pain Details: 18 G 1.5 in needle, superolateral approach  Arthrogram: No  Medications (Right): 5 mL lidocaine 1 %; 4 mL bupivacaine 0.25 %; 40 mg methylPREDNISolone acetate 40 MG/ML Medications (Left): 5 mL lidocaine 1 %; 4 mL bupivacaine 0.25 %; 40 mg  methylPREDNISolone acetate 40 MG/ML Outcome: tolerated well, no immediate complications Procedure, treatment alternatives, risks and benefits explained, specific risks discussed. Consent was given by the patient. Immediately prior to procedure a time out was called to verify the correct patient, procedure, equipment, support staff and site/side marked as required. Patient was prepped and draped in the usual sterile fashion.       Clinical Data: No additional findings.  Objective: Vital Signs: There were no vitals taken for this visit.  Physical Exam:  Constitutional: Patient appears well-developed HEENT:  Head: Normocephalic Eyes:EOM are normal Neck: Normal range of motion Cardiovascular: Normal rate Pulmonary/chest: Effort normal Neurologic: Patient is alert Skin: Skin is warm Psychiatric: Patient has normal mood and affect  Ortho Exam: Ortho exam demonstrates bilateral knees with about 5 to 10 degree flexion contracture in the right knee and 2 to 3 degree flexion contracture in the left knee.  No effusion in either knee today.  No calf tenderness.  Negative Homans' sign.  No cellulitis or skin changes noted around either knee.  She has tenderness diffusely through the low back.  Specialty Comments:  No specialty comments available.  Imaging: No results found.   PMFS History: Patient Active Problem List   Diagnosis Date Noted   Anemia due to other cause 09/23/2014   Leukocytosis 09/23/2014   Cellulitis of buttock, right 09/22/2014   Cellulitis of right buttock 09/22/2014   Family hx of colon cancer 02/27/2011   Other constipation 02/27/2011   Past Medical History:  Diagnosis Date   COPD (chronic obstructive pulmonary disease) (HCC)    Depression    Fluttering heart    GERD (gastroesophageal reflux disease)    pt sts sfter egd with ed and diet change is on no meds and has no more problems with this   Hyperlipidemia    Leg pain    Osteopenia    previously on  Fosamax    Family History  Problem Relation Age of Onset   Colon cancer Brother 69       deceased   Diabetes Father    Lupus Sister    Anesthesia problems Neg Hx    Hypotension Neg Hx    Malignant hyperthermia Neg Hx    Pseudochol deficiency Neg Hx     Past Surgical History:  Procedure Laterality Date   ABDOMINAL HYSTERECTOMY     1990's   CERVICAL DISC SURGERY     COLONOSCOPY  03/14/2011   Procedure: COLONOSCOPY;  Surgeon: Corbin Ade, MD;  Location: [facility];  Service: Gastroenterology;  Laterality: N/A;  In the cecum at 1332.  Total withdrawal time 8    minutes.   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

## 2023-02-14 NOTE — Telephone Encounter (Signed)
PA completed. Needs signature and then will fax

## 2023-02-17 NOTE — Telephone Encounter (Signed)
Called and scheduled

## 2023-02-17 NOTE — Telephone Encounter (Signed)
Prior PA completed and approved until 06/2023

## 2023-02-17 NOTE — Telephone Encounter (Signed)
Approved for Monovisc-Bilateral knee Buy and Bill $10 copay Covered @ 75% until OOP is met then covered at 100% PA approved until 06/2023

## 2023-03-02 ENCOUNTER — Other Ambulatory Visit: Payer: 59

## 2023-03-12 ENCOUNTER — Ambulatory Visit: Payer: 59 | Admitting: Orthopedic Surgery

## 2023-03-12 DIAGNOSIS — M170 Bilateral primary osteoarthritis of knee: Secondary | ICD-10-CM

## 2023-03-14 ENCOUNTER — Encounter: Payer: Self-pay | Admitting: Orthopedic Surgery

## 2023-03-14 MED ORDER — LIDOCAINE HCL 1 % IJ SOLN
5.0000 mL | INTRAMUSCULAR | Status: AC | PRN
Start: 2023-03-12 — End: 2023-03-12
  Administered 2023-03-12: 5 mL

## 2023-03-14 MED ORDER — HYALURONAN 88 MG/4ML IX SOSY
88.0000 mg | PREFILLED_SYRINGE | INTRA_ARTICULAR | Status: AC | PRN
Start: 2023-03-12 — End: 2023-03-12
  Administered 2023-03-12: 88 mg via INTRA_ARTICULAR

## 2023-03-14 NOTE — Progress Notes (Signed)
   Procedure Note  Patient: Rebecca Cohen             Date of Birth: 1955-11-20           MRN: [id number]             Visit Date: 03/12/2023  Procedures: Visit Diagnoses:  1. Bilateral primary osteoarthritis of knee     Large Joint Inj: R knee on 03/12/2023 4:23 PM Indications: pain, joint swelling and diagnostic evaluation Details: 18 G 1.5 in needle, superolateral approach  Arthrogram: No  Medications: 5 mL lidocaine 1 %; 88 mg Hyaluronan 88 MG/4ML Outcome: tolerated well, no immediate complications Procedure, treatment alternatives, risks and benefits explained, specific risks discussed. Consent was given by the patient. Immediately prior to procedure a time out was called to verify the correct patient, procedure, equipment, support staff and site/side marked as required. Patient was prepped and draped in the usual sterile fashion.    Large Joint Inj: L knee on 03/12/2023 4:24 PM Indications: pain, joint swelling and diagnostic evaluation Details: 18 G 1.5 in needle, superolateral approach  Arthrogram: No  Medications: 5 mL lidocaine 1 %; 88 mg Hyaluronan 88 MG/4ML Outcome: tolerated well, no immediate complications Procedure, treatment alternatives, risks and benefits explained, specific risks discussed. Consent was given by the patient. Immediately prior to procedure a time out was called to verify the correct patient, procedure, equipment, support staff and site/side marked as required. Patient was prepped and draped in the usual sterile fashion.

## 2023-04-23 DIAGNOSIS — F1120 Opioid dependence, uncomplicated: Secondary | ICD-10-CM | POA: Diagnosis not present

## 2023-04-23 DIAGNOSIS — Z20828 Contact with and (suspected) exposure to other viral communicable diseases: Secondary | ICD-10-CM | POA: Diagnosis not present

## 2023-04-23 DIAGNOSIS — J329 Chronic sinusitis, unspecified: Secondary | ICD-10-CM | POA: Diagnosis not present

## 2023-04-23 DIAGNOSIS — G894 Chronic pain syndrome: Secondary | ICD-10-CM | POA: Diagnosis not present

## 2023-04-23 DIAGNOSIS — M5126 Other intervertebral disc displacement, lumbar region: Secondary | ICD-10-CM | POA: Diagnosis not present

## 2023-04-23 DIAGNOSIS — M1991 Primary osteoarthritis, unspecified site: Secondary | ICD-10-CM | POA: Diagnosis not present

## 2023-04-23 DIAGNOSIS — F419 Anxiety disorder, unspecified: Secondary | ICD-10-CM | POA: Diagnosis not present

## 2023-04-23 DIAGNOSIS — Z6825 Body mass index (BMI) 25.0-25.9, adult: Secondary | ICD-10-CM | POA: Diagnosis not present

## 2023-04-23 DIAGNOSIS — M5416 Radiculopathy, lumbar region: Secondary | ICD-10-CM | POA: Diagnosis not present

## 2023-05-13 DIAGNOSIS — E663 Overweight: Secondary | ICD-10-CM | POA: Diagnosis not present

## 2023-05-13 DIAGNOSIS — Z0001 Encounter for general adult medical examination with abnormal findings: Secondary | ICD-10-CM | POA: Diagnosis not present

## 2023-05-13 DIAGNOSIS — M546 Pain in thoracic spine: Secondary | ICD-10-CM | POA: Diagnosis not present

## 2023-05-13 DIAGNOSIS — M180 Bilateral primary osteoarthritis of first carpometacarpal joints: Secondary | ICD-10-CM | POA: Diagnosis not present

## 2023-05-13 DIAGNOSIS — F1120 Opioid dependence, uncomplicated: Secondary | ICD-10-CM | POA: Diagnosis not present

## 2023-05-13 DIAGNOSIS — M5126 Other intervertebral disc displacement, lumbar region: Secondary | ICD-10-CM | POA: Diagnosis not present

## 2023-05-13 DIAGNOSIS — G894 Chronic pain syndrome: Secondary | ICD-10-CM | POA: Diagnosis not present

## 2023-05-13 DIAGNOSIS — Z23 Encounter for immunization: Secondary | ICD-10-CM | POA: Diagnosis not present

## 2023-05-13 DIAGNOSIS — Z6825 Body mass index (BMI) 25.0-25.9, adult: Secondary | ICD-10-CM | POA: Diagnosis not present

## 2023-05-13 DIAGNOSIS — Z1331 Encounter for screening for depression: Secondary | ICD-10-CM | POA: Diagnosis not present

## 2023-05-13 DIAGNOSIS — M810 Age-related osteoporosis without current pathological fracture: Secondary | ICD-10-CM | POA: Diagnosis not present

## 2023-05-13 DIAGNOSIS — M1991 Primary osteoarthritis, unspecified site: Secondary | ICD-10-CM | POA: Diagnosis not present

## 2023-11-11 LAB — LAB REPORT - SCANNED
EGFR: 91
TSH: 3.5

## 2024-01-15 ENCOUNTER — Other Ambulatory Visit: Payer: Self-pay | Admitting: Internal Medicine

## 2024-01-15 DIAGNOSIS — Z1231 Encounter for screening mammogram for malignant neoplasm of breast: Secondary | ICD-10-CM

## 2024-01-21 ENCOUNTER — Inpatient Hospital Stay: Admission: RE | Admit: 2024-01-21 | Source: Ambulatory Visit

## 2024-04-09 ENCOUNTER — Encounter: Payer: Self-pay | Admitting: Radiology

## 2024-05-31 ENCOUNTER — Encounter (HOSPITAL_COMMUNITY): Payer: Self-pay

## 2024-05-31 DIAGNOSIS — Z720 Tobacco use: Secondary | ICD-10-CM

## 2024-06-01 ENCOUNTER — Other Ambulatory Visit (HOSPITAL_COMMUNITY): Payer: Self-pay

## 2024-06-01 DIAGNOSIS — Z87891 Personal history of nicotine dependence: Secondary | ICD-10-CM

## 2024-06-01 DIAGNOSIS — Z122 Encounter for screening for malignant neoplasm of respiratory organs: Secondary | ICD-10-CM

## 2024-06-01 DIAGNOSIS — Z720 Tobacco use: Secondary | ICD-10-CM

## 2024-06-06 ENCOUNTER — Ambulatory Visit (HOSPITAL_COMMUNITY): Admission: RE | Admit: 2024-06-06 | Source: Ambulatory Visit

## 2024-06-06 ENCOUNTER — Encounter (HOSPITAL_COMMUNITY): Payer: Self-pay

## 2024-06-21 ENCOUNTER — Encounter: Payer: Self-pay | Admitting: Radiology

## 2024-07-29 ENCOUNTER — Ambulatory Visit

## 2024-08-16 ENCOUNTER — Other Ambulatory Visit: Payer: Self-pay

## 2024-08-16 ENCOUNTER — Ambulatory Visit (INDEPENDENT_AMBULATORY_CARE_PROVIDER_SITE_OTHER): Payer: Self-pay | Admitting: "Endocrinology

## 2024-08-16 ENCOUNTER — Encounter: Payer: Self-pay | Admitting: "Endocrinology

## 2024-08-16 VITALS — BP 106/70 | HR 89 | Resp 18 | Ht 66.0 in | Wt 169.0 lb

## 2024-08-16 DIAGNOSIS — E2749 Other adrenocortical insufficiency: Secondary | ICD-10-CM | POA: Diagnosis not present

## 2024-08-16 NOTE — Progress Notes (Signed)
 "          Endocrinology Consult Note                                            08/16/2024, 5:21 PM   Subjective:    Patient ID: Rebecca Cohen, female    DOB: [birth year], PCP Shona Norleen PEDLAR, MD   Past Medical History:  Diagnosis Date   COPD (chronic obstructive pulmonary disease) (HCC)    Depression    Fluttering heart    GERD (gastroesophageal reflux disease)    pt sts sfter egd with ed and diet change is on no meds and has no more problems with this   Hyperlipidemia    Leg pain    Osteopenia    previously on Fosamax   Past Surgical History:  Procedure Laterality Date   ABDOMINAL HYSTERECTOMY     1990's   CERVICAL DISC SURGERY     COLONOSCOPY  03/14/2011   Procedure: COLONOSCOPY;  Surgeon: Lamar CHRISTELLA Hollingshead, MD;  Location: [facility];  Service: Gastroenterology;  Laterality: N/A;  In the cecum at 1332.  Total withdrawal time 8    minutes.   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   s/p hysterectomy     Social History   Socioeconomic History   Marital status: Married    Spouse name: Not on file   Number of children: 3   Years of education: Not on file   Highest education level: Not on file  Occupational History   Occupation: works in [employer]: UNEMPLOYED  Tobacco Use   Smoking status: Every Day    Current packs/day: 0.50    Average packs/day: 0.5 packs/day for 20.0 years (10.0 ttl pk-yrs)    Types: Cigarettes   Smokeless tobacco: Never   Tobacco comments:    1/2 pack a day  Substance and Sexual Activity   Alcohol use: No   Drug use: No   Sexual activity: Yes    Birth control/protection: Surgical  Other Topics Concern   Not on file  Social History Narrative   Not on file   Social Drivers of Health   Tobacco Use: High Risk (08/16/2024)   Patient History    Smoking Tobacco Use: Every Day    Smokeless Tobacco Use: Never    Passive Exposure: Not on file  Financial Resource Strain: Not  on file  Food Insecurity: Not on file  Transportation Needs: Not on file  Physical Activity: Not on file  Stress: Not on file  Social Connections: Not on file  Depression (EYV7-0): Not on file  Alcohol Screen: Not on file  Housing: Not on file  Utilities: Not on file  Health Literacy: Not on file   Family History  Problem Relation Age of Onset   Colon cancer Brother 42       deceased   Diabetes Father    Lupus Sister    Anesthesia problems Neg Hx    Hypotension Neg Hx    Malignant hyperthermia Neg Hx    Pseudochol deficiency Neg Hx    Outpatient Encounter Medications as of 08/16/2024  Medication Sig   acyclovir  (ZOVIRAX ) 400 MG tablet Take 400 mg by mouth 5 (five) times daily. When flare up occurs   alendronate (FOSAMAX) 70 MG tablet Take by mouth.   aspirin 81  MG chewable tablet Chew 81 mg by mouth daily.   calcium  carbonate (OSCAL) 1500 (600 Ca) MG TABS tablet Take 600 mg of elemental calcium  by mouth 2 (two) times daily with a meal.   cholecalciferol (VITAMIN D3) 25 MCG (1000 UNIT) tablet Take 1,000 Units by mouth daily.   diphenhydrAMINE  (BENADRYL ) 25 MG tablet Take 25 mg by mouth daily as needed for allergies.    HYDROcodone -acetaminophen  (NORCO) 7.5-325 MG tablet Take 1 tablet by mouth every 4 (four) hours.   ibuprofen (ADVIL) 200 MG tablet Take 200 mg by mouth every 6 (six) hours as needed.   LORazepam  (ATIVAN ) 2 MG tablet Take 2 mg by mouth daily.   mirtazapine  (REMERON ) 30 MG tablet Take 30 mg by mouth at bedtime.   Multiple Vitamin (MULTIVITAMIN) tablet Take 1 tablet by mouth daily.   Quercetin 250 MG TABS Take 1 tablet by mouth.   zinc gluconate 50 MG tablet Take 50 mg by mouth daily.   No facility-administered encounter medications on file as of 08/16/2024.   ALLERGIES: Allergies[1]  VACCINATION STATUS: Immunization History  Administered Date(s) Administered   Pneumococcal Polysaccharide-23 09/23/2014    HPI Rebecca Cohen is 68 y.o. female who  presents today with a medical history as above. she is being seen in consultation for hypocortisolemia requested by Shona Norleen PEDLAR, MD.  History is obtained directly from the patient as well as chart review.  She gives history of exposure to steroids over several months-years related to arthralgias, COPD,.  She is not on glucocorticoids at this time.  However she is on opioids involving hydrocodone  every 4 hours for pain control. In March 2025, random blood work showed hypocortisolemia 4.4.  She does not have acute complaints today. She is not on antihypertensive medications.  She reports fluctuating body weight.  She denies any adrenal surgery or ablation.  She denies any familial adrenal syndromes.  She continues to smoke.  She walks with a cane.  Review of Systems  Constitutional: +mildly fluctuating body weight, + fatigue, no subjective hyperthermia, no subjective hypothermia Eyes: no blurry vision, no xerophthalmia ENT: no sore throat, no nodules palpated in throat, no dysphagia/odynophagia, no hoarseness Cardiovascular: no Chest Pain, no Shortness of Breath, no palpitations, no leg swelling Respiratory: no cough, no shortness of breath Gastrointestinal: no Nausea/Vomiting/Diarhhea Musculoskeletal: + muscle  +/joint aches Skin: no rashes Neurological: no tremors, no numbness, no tingling, no dizziness Psychiatric: no depression, no anxiety  Objective:       08/16/2024    1:46 PM 06/19/2021    2:00 PM 01/11/2021    9:45 AM  Vitals with BMI  Height 5' 6 5' 7 5' 6  Weight 169 lbs 144 lbs 10 oz 162 lbs 10 oz  BMI 27.29 22.64 26.26  Systolic 106 131 866  Diastolic 70 82 81  Pulse 89 93 87    BP 106/70   Pulse 89   Resp 18   Ht 5' 6 (1.676 m)   Wt 169 lb (76.7 kg)   SpO2 98%   BMI 27.28 kg/m   Wt Readings from Last 3 Encounters:  08/16/24 169 lb (76.7 kg)  06/19/21 144 lb 9.6 oz (65.6 kg)  01/11/21 162 lb 9.6 oz (73.8 kg)    Physical Exam  Constitutional:  Body mass  index is 27.28 kg/m.,  not in acute distress, normal state of mind, + walks with a cane Eyes: PERRLA, EOMI, no exophthalmos ENT: moist mucous membranes, no gross thyromegaly, no gross cervical lymphadenopathy  Cardiovascular: normal precordial activity, Regular Rate and Rhythm, no Murmur/Rubs/Gallops Respiratory:  adequate breathing efforts, no gross chest deformity, Clear to auscultation bilaterally Gastrointestinal: abdomen soft, Non -tender, No distension, Bowel Sounds present, no gross organomegaly Musculoskeletal: no gross deformities, strength intact in all four extremities, no peripheral edema Skin: moist, warm, no rashes Neurological: no tremor with outstretched hands, Deep tendon reflexes normal in bilateral lower extremities.  CMP ( most recent) CMP     Component Value Date/Time   NA 139 09/29/2015 1642   K 3.9 09/29/2015 1642   CL 105 09/29/2015 1642   CO2 26 09/29/2015 1642   GLUCOSE 97 09/29/2015 1642   BUN 8 09/29/2015 1642   CREATININE 0.68 09/29/2015 1642   CALCIUM  9.4 09/29/2015 1642   PROT 6.0 09/23/2014 0616   ALBUMIN 2.7 (L) 09/23/2014 0616   AST 25 09/23/2014 0616   ALT 33 09/23/2014 0616   ALKPHOS 117 09/23/2014 0616   BILITOT 0.4 09/23/2014 0616   EGFR 91.0 11/11/2023 1231   GFRNONAA >60 09/29/2015 1642     Diabetic Labs (most recent): Lab Results  Component Value Date   HGBA1C 5.4 09/23/2014     Lipid Panel ( most recent) Lipid Panel  No results found for: CHOL, TRIG, HDL, CHOLHDL, VLDL, LDLCALC, LDLDIRECT, LABVLDL    Lab Results  Component Value Date   TSH 3.500 11/11/2023   TSH 1.793 09/22/2014      November 05, 2023 random cortisol 4.4    Assessment & Plan:   1. Hypocortisolemia (Primary)  - Rebecca Cohen  is being seen at a kind request of Shona, Norleen PEDLAR, MD. - I have reviewed her available  records and clinically evaluated the patient. - Based on these reviews, she has hypocortisolemia of 4.4,  however,  there is  not sufficient information to proceed with definitive treatment plan.  - she will need a repeat,  more complete lab work towards confirming the diagnosis.  This will involve ACTH stimulation test. -she will return in 3 week to review her repeat labs. Risk factor for adrenal insufficiency in this patient will be the intermittent exposure to steroids as well as ongoing treatment with opioids for pain control.  - I did not initiate any new prescriptions today. - she is advised to maintain close follow up with Shona Norleen PEDLAR, MD for primary care needs.   -Thank you for involving me in the care of this pleasant patient.  Time spent with the patient: 45  minutes spent in  counseling her about hypocortisolemia with risk of adrenal insufficiency and the rest in obtaining information about her symptoms, reviewing her previous labs/studies (including abstractions from other facilities),  evaluations, and treatments,  and developing a plan to confirm diagnosis and long term treatment based on the latest standards of care/guidelines; and documenting her care.  Rebecca Cohen participated in the discussions, expressed understanding, and voiced agreement with the above plans.  All questions were answered to her satisfaction. she is encouraged to contact clinic should she have any questions or concerns prior to her return visit.  Follow up plan: Return in about 3 weeks (around 09/06/2024) for F/U with Pre-visit Labs.   Ranny Earl, MD [facility] [address] [place], [place] [address] Phone: [phone]  Fax: [phone]     08/16/2024, 5:21 PM  This note was partially dictated with voice recognition software. Similar sounding words can be transcribed inadequately or may not  be corrected upon review.     [  1]  Allergies Allergen Reactions   Bextra [Valdecoxib] Anaphylaxis   Sulfa Drugs Cross Reactors Anaphylaxis and Rash   "

## 2024-08-25 ENCOUNTER — Encounter: Attending: "Endocrinology | Admitting: Emergency Medicine

## 2024-08-25 VITALS — BP 125/74 | HR 89 | Temp 97.9°F | Resp 18

## 2024-08-25 DIAGNOSIS — E2749 Other adrenocortical insufficiency: Secondary | ICD-10-CM | POA: Insufficient documentation

## 2024-08-25 MED ORDER — COSYNTROPIN 0.25 MG IJ SOLR
0.2500 mg | Freq: Once | INTRAMUSCULAR | Status: AC
  Administered 2024-08-25: 0.25 mg via INTRAMUSCULAR

## 2024-08-25 NOTE — Progress Notes (Signed)
 Diagnosis:  Hypocortisolemia    Provider:  Nida, Gebreselassie Woldeberhan MD  Procedure: Injection  Cortrosyn  (cosyntropin ), Dose: 0.25 mg, Site: intramuscular, Number of injections: 1 Initial lab drawn, 30/60 min post injection labs drawn Injection Site(s): Right deltoid  Post Care: Patient declined observation  Discharge: Condition: Good, Destination: Home . AVS Declined  Performed by:  Delon ONEIDA Officer, RN

## 2024-08-26 LAB — ACTH STIMULATION, 3 TIME POINTS
Cortisol #1 (Base): 4.4 ug/dL — ABNORMAL LOW (ref 6.2–19.4)
Cortisol #2: 15.6 ug/dL
Cortisol #3: 16.9 ug/dL

## 2024-09-01 ENCOUNTER — Ambulatory Visit

## 2024-09-13 ENCOUNTER — Ambulatory Visit: Admitting: "Endocrinology

## 2024-09-17 ENCOUNTER — Ambulatory Visit: Admitting: "Endocrinology

## 2024-10-07 ENCOUNTER — Ambulatory Visit: Admitting: "Endocrinology
